# Patient Record
Sex: Female | Born: 1960 | Race: Black or African American | Hispanic: No | Marital: Single | Smoking: Former smoker
Health system: Southern US, Community
[De-identification: ages and names within clinical notes are randomized; demographics above are authoritative.]

## PROBLEM LIST (undated history)

## (undated) DIAGNOSIS — R001 Bradycardia, unspecified: Secondary | ICD-10-CM

## (undated) DIAGNOSIS — I10 Essential (primary) hypertension: Secondary | ICD-10-CM

## (undated) HISTORY — DX: Bradycardia, unspecified: R00.1

## (undated) HISTORY — PX: ABDOMINAL HYSTERECTOMY: SHX81

---

## 2017-09-24 ENCOUNTER — Encounter: Payer: Self-pay | Admitting: Obstetrics and Gynecology

## 2017-12-25 ENCOUNTER — Other Ambulatory Visit: Payer: Self-pay | Admitting: Family Medicine

## 2017-12-25 DIAGNOSIS — R1032 Left lower quadrant pain: Principal | ICD-10-CM

## 2017-12-25 DIAGNOSIS — R1031 Right lower quadrant pain: Secondary | ICD-10-CM

## 2017-12-30 ENCOUNTER — Ambulatory Visit: Admission: RE | Admit: 2017-12-30 | Payer: BLUE CROSS/BLUE SHIELD | Source: Ambulatory Visit

## 2019-03-02 ENCOUNTER — Encounter: Payer: Self-pay | Admitting: Emergency Medicine

## 2019-03-02 ENCOUNTER — Ambulatory Visit
Admission: EM | Admit: 2019-03-02 | Discharge: 2019-03-02 | Disposition: A | Discharge status: Home / Self Care | Payer: BLUE CROSS/BLUE SHIELD | Attending: Family Medicine | Admitting: Family Medicine

## 2019-03-02 ENCOUNTER — Other Ambulatory Visit: Payer: Self-pay

## 2019-03-02 DIAGNOSIS — J029 Acute pharyngitis, unspecified: Secondary | ICD-10-CM

## 2019-03-02 DIAGNOSIS — B349 Viral infection, unspecified: Secondary | ICD-10-CM

## 2019-03-02 HISTORY — DX: Essential (primary) hypertension: I10

## 2019-03-02 LAB — RAPID STREP SCREEN (MED CTR MEBANE ONLY): Streptococcus, Group A Screen (Direct): NEGATIVE

## 2019-03-02 NOTE — Discharge Instructions (Signed)
Rest, fluids, tylenol/advil, monitor for symptoms Guidelines per [facility]

## 2019-03-02 NOTE — ED Triage Notes (Signed)
Patient c/o sore throat and low grade x 1 week. She does report a cough that started last night. Patient has not taken any OTC medications for her symptoms.

## 2019-03-04 LAB — CULTURE, GROUP A STREP (THRC)

## 2019-03-07 NOTE — ED Provider Notes (Signed)
[facility]    CSN: [id number] Arrival date & time: 03/02/19  1402     History   Chief Complaint Chief Complaint  Patient presents with  . Sore Throat    APPT  . Fever    HPI Becky Barnes is a 58 y.o. female.   The history is provided by the patient.  Sore Throat   Fever  Associated symptoms: cough (mild) and sore throat   URI  Presenting symptoms: cough (mild), fever and sore throat   Severity:  Moderate Onset quality:  Sudden Duration:  1 day Timing:  Constant Progression:  Unchanged Chronicity:  New Relieved by:  None tried Ineffective treatments:  None tried Associated symptoms: no wheezing   Risk factors: no recent travel and no sick contacts     Past Medical History:  Diagnosis Date  . Hypertension     There are no active problems to display for this patient.   Past Surgical History:  Procedure Laterality Date  . ABDOMINAL HYSTERECTOMY      OB History   No obstetric history on file.      Home Medications    Prior to Admission medications   Medication Sig Start Date End Date Taking? Authorizing Provider  hydrochlorothiazide (HYDRODIURIL) 12.5 MG tablet Take by mouth. 12/22/18  Yes [provider]  lisinopril (ZESTRIL) 40 MG tablet Take by mouth. 12/22/18  Yes [provider]    Family History History reviewed. No pertinent family history.  Social History Social History   Tobacco Use  . Smoking status: Never Smoker  . Smokeless tobacco: Never Used  Substance Use Topics  . Alcohol use: Never    Frequency: Never  . Drug use: Never     Allergies   Patient has no known allergies.   Review of Systems Review of Systems  Constitutional: Positive for fever.  HENT: Positive for sore throat.   Respiratory: Positive for cough (mild). Negative for wheezing.      Physical Exam Triage Vital Signs ED Triage Vitals  Enc Vitals Group     BP 03/02/19 1426 123/88     Pulse Rate 03/02/19 1426 77   Resp 03/02/19 1426 18     Temp 03/02/19 1426 98.8 F (37.1 C)     Temp Source 03/02/19 1426 Oral     SpO2 03/02/19 1426 99 %     Weight 03/02/19 1423 190 lb (86.2 kg)     Height 03/02/19 1423 5\' 5"  (1.651 m)     Head Circumference --      Peak Flow --      Pain Score 03/02/19 1423 2     Pain Loc --      Pain Edu? --      Excl. in [place]? --    No data found.  Updated Vital Signs BP 123/88 (BP Location: Right Arm)   Pulse 77   Temp 98.8 F (37.1 C) (Oral)   Resp 18   Ht 5\' 5"  (1.651 m)   Wt 86.2 kg   SpO2 99%   BMI 31.62 kg/m   Visual Acuity Right Eye Distance:   Left Eye Distance:   Bilateral Distance:    Right Eye Near:   Left Eye Near:    Bilateral Near:     Physical Exam Vitals signs and nursing note reviewed.  Constitutional:      General: She is not in acute distress.    Appearance: She is not toxic-appearing or diaphoretic.  HENT:  Mouth/Throat:     Pharynx: Posterior oropharyngeal erythema present.  Neck:     Musculoskeletal: Neck supple.  Cardiovascular:     Rate and Rhythm: Normal rate.  Pulmonary:     Effort: Pulmonary effort is normal. No respiratory distress.     Breath sounds: No stridor.  Neurological:     Mental Status: She is alert.      UC Treatments / Results  Labs (all labs ordered are listed, but only abnormal results are displayed) Labs Reviewed  RAPID STREP SCREEN ([facility] ONLY)  CULTURE, GROUP A STREP [facility])    EKG None  Radiology No results found.  Procedures Procedures (including critical care time)  Medications Ordered in UC Medications - No data to display  Initial Impression / Assessment and Plan / UC Course  I have reviewed the triage vital signs and the nursing notes.  Pertinent labs & imaging results that were available during my care of the patient were reviewed by me and considered in my medical decision making (see chart for details).      Final Clinical Impressions(s) / UC Diagnoses    Final diagnoses:  Viral pharyngitis  Viral illness     Discharge Instructions     Rest, fluids, tylenol/advil, monitor for symptoms Guidelines [facility]    ED Prescriptions    None     1. Lab results and diagnosis reviewed with patient 2. Recommend supportive treatment as above 3. Follow-up prn if symptoms worsen or don't improve  Controlled Substance Prescriptions [place] Controlled Substance Registry consulted? Not Applicable   Becky Barnes, Nikole Swartzentruber, MD 03/07/19 [phone]

## 2019-03-10 ENCOUNTER — Ambulatory Visit
Admission: EM | Admit: 2019-03-10 | Discharge: 2019-03-10 | Disposition: A | Discharge status: Home / Self Care | Payer: BLUE CROSS/BLUE SHIELD | Attending: Family Medicine | Admitting: Family Medicine

## 2019-03-10 ENCOUNTER — Encounter: Payer: Self-pay | Admitting: Emergency Medicine

## 2019-03-10 ENCOUNTER — Other Ambulatory Visit: Payer: Self-pay

## 2019-03-10 DIAGNOSIS — J029 Acute pharyngitis, unspecified: Secondary | ICD-10-CM

## 2019-03-10 DIAGNOSIS — R197 Diarrhea, unspecified: Secondary | ICD-10-CM

## 2019-03-10 LAB — CBC WITH DIFFERENTIAL/PLATELET
Abs Immature Granulocytes: 0.01 10*3/uL (ref 0.00–0.07)
Basophils Absolute: 0 10*3/uL (ref 0.0–0.1)
Basophils Relative: 1 %
Eosinophils Absolute: 0.1 10*3/uL (ref 0.0–0.5)
Eosinophils Relative: 2 %
HCT: 41.8 % (ref 36.0–46.0)
Hemoglobin: 14.1 g/dL (ref 12.0–15.0)
Immature Granulocytes: 0 %
Lymphocytes Relative: 27 %
Lymphs Abs: 1.6 10*3/uL (ref 0.7–4.0)
MCH: 29.9 pg (ref 26.0–34.0)
MCHC: 33.7 g/dL (ref 30.0–36.0)
MCV: 88.6 fL (ref 80.0–100.0)
Monocytes Absolute: 0.4 10*3/uL (ref 0.1–1.0)
Monocytes Relative: 7 %
Neutro Abs: 3.7 10*3/uL (ref 1.7–7.7)
Neutrophils Relative %: 63 %
Platelets: 317 10*3/uL (ref 150–400)
RBC: 4.72 MIL/uL (ref 3.87–5.11)
RDW: 13 % (ref 11.5–15.5)
WBC: 5.9 10*3/uL (ref 4.0–10.5)
nRBC: 0 % (ref 0.0–0.2)

## 2019-03-10 LAB — BASIC METABOLIC PANEL
Anion gap: 8 (ref 5–15)
BUN: 20 mg/dL (ref 6–20)
CO2: 25 mmol/L (ref 22–32)
Calcium: 9.4 mg/dL (ref 8.9–10.3)
Chloride: 105 mmol/L (ref 98–111)
Creatinine, Ser: 0.95 mg/dL (ref 0.44–1.00)
GFR calc Af Amer: >60 mL/min (ref >60)
GFR calc non Af Amer: >60 mL/min (ref >60)
Glucose, Bld: 124 mg/dL — ABNORMAL HIGH (ref 70–99)
Potassium: 3.9 mmol/L (ref 3.5–5.1)
Sodium: 138 mmol/L (ref 135–145)

## 2019-03-10 NOTE — Discharge Instructions (Signed)
Imodium AD over the counter for diarrhea Increase fluids Salt water gargles

## 2019-03-10 NOTE — ED Triage Notes (Signed)
Patient c/o sore throat and diarrhea off and on for over a week.  Patient was seen on 03/02/19.  Patient states that she is starting to feel better.  Patient reports low grade fevers but has not check her temperature today.

## 2019-03-10 NOTE — ED Provider Notes (Signed)
[facility]    CSN: [id number] Arrival date & time: 03/10/19  [id number]     History   Chief Complaint Chief Complaint  Patient presents with  . Sore Throat  . Diarrhea    HPI Becky Barnes is a 58 y.o. female.   58 yo female with a c/o sore throat and diarrhea for the past approximately 2 weeks. Patient states stools are soft. Overall symptoms are improving, however have not resolved. Patient was seen here last week and had a negative strep test. States she was also tested for COVID-19  at the public health department and was negative. Patient states she works at [employer] [place]'s recently been a COVID-19 outbreak.   The history is provided by the patient.  Sore Throat   Diarrhea    Past Medical History:  Diagnosis Date  . Hypertension     There are no active problems to display for this patient.   Past Surgical History:  Procedure Laterality Date  . ABDOMINAL HYSTERECTOMY      OB History   No obstetric history on file.      Home Medications    Prior to Admission medications   Medication Sig Start Date End Date Taking? Authorizing Provider  hydrochlorothiazide (HYDRODIURIL) 12.5 MG tablet Take by mouth. 12/22/18  Yes [provider]  lisinopril (ZESTRIL) 40 MG tablet Take by mouth. 12/22/18  Yes [provider]    Family History History reviewed. No pertinent family history.  Social History Social History   Tobacco Use  . Smoking status: Never Smoker  . Smokeless tobacco: Never Used  Substance Use Topics  . Alcohol use: Never    Frequency: Never  . Drug use: Never     Allergies   Patient has no known allergies.   Review of Systems Review of Systems  Gastrointestinal: Positive for diarrhea.     Physical Exam Triage Vital Signs ED Triage Vitals  Enc Vitals Group     BP 03/10/19 0847 130/70     Pulse Rate 03/10/19 0847 62     Resp 03/10/19 0847 16     Temp 03/10/19  0847 98.2 F (36.8 C)     Temp Source 03/10/19 0847 Oral     SpO2 03/10/19 0847 99 %     Weight 03/10/19 0845 190 lb (86.2 kg)     Height 03/10/19 0845 5\' 5"  (1.651 m)     Head Circumference --      Peak Flow --      Pain Score 03/10/19 0845 3     Pain Loc --      Pain Edu? --      Excl. in GC? --    No data found.  Updated Vital Signs BP 130/70 (BP Location: Left Arm)   Pulse 62   Temp 98.2 F (36.8 C) (Oral)   Resp 16   Ht 5\' 5"  (1.651 m)   Wt 86.2 kg   SpO2 99%   BMI 31.62 kg/m   Visual Acuity Right Eye Distance:   Left Eye Distance:   Bilateral Distance:    Right Eye Near:   Left Eye Near:    Bilateral Near:     Physical Exam Vitals signs reviewed.  Constitutional:      General: She is not in acute distress.    Appearance: She is not toxic-appearing or diaphoretic.  HENT:     Mouth/Throat:     Pharynx: Posterior oropharyngeal erythema  present.  Cardiovascular:     Rate and Rhythm: Normal rate.  Pulmonary:     Effort: Pulmonary effort is normal. No respiratory distress.     Breath sounds: Normal breath sounds.  Abdominal:     General: Bowel sounds are normal. There is no distension.     Palpations: Abdomen is soft. There is no mass.     Tenderness: There is no abdominal tenderness. There is no right CVA tenderness, left CVA tenderness, guarding or rebound.  Neurological:     Mental Status: She is alert.      UC Treatments / Results  Labs (all labs ordered are listed, but only abnormal results are displayed) Labs Reviewed  BASIC METABOLIC PANEL - Abnormal; Notable for the following components:      Result Value   Glucose, Bld 124 (*)    All other components within normal limits  CBC WITH DIFFERENTIAL/PLATELET    EKG None  Radiology No results found.  Procedures Procedures (including critical care time)  Medications Ordered in UC Medications - No data to display  Initial Impression / Assessment and Plan / UC Course  I have reviewed  the triage vital signs and the nursing notes.  Pertinent labs & imaging results that were available during my care of the patient were reviewed by me and considered in my medical decision making (see chart for details).      Final Clinical Impressions(s) / UC Diagnoses   Final diagnoses:  Sore throat  Diarrhea, unspecified type     Discharge Instructions     Imodium AD over the counter for diarrhea Increase fluids Salt water gargles    ED Prescriptions    None     1. Lab results and diagnosis reviewed with patient 2. Recommend supportive treatment as above 3. Follow-up prn if symptoms worsen or don't improve   Controlled Substance Prescriptions [place] Controlled Substance Registry consulted? Not Applicable   Payton Mccallum, MD 03/10/19 1001

## 2019-08-11 ENCOUNTER — Other Ambulatory Visit: Payer: Self-pay

## 2019-08-11 DIAGNOSIS — Z20822 Contact with and (suspected) exposure to covid-19: Secondary | ICD-10-CM

## 2019-08-11 DIAGNOSIS — Z20828 Contact with and (suspected) exposure to other viral communicable diseases: Secondary | ICD-10-CM

## 2019-08-12 LAB — NOVEL CORONAVIRUS, NAA: SARS-CoV-2, NAA: NOT DETECTED

## 2019-08-14 ENCOUNTER — Telehealth: Payer: Self-pay | Admitting: General Practice

## 2019-08-14 NOTE — Telephone Encounter (Signed)
Gave patient negative test results °Patient understood  °

## 2019-08-21 ENCOUNTER — Ambulatory Visit (INDEPENDENT_AMBULATORY_CARE_PROVIDER_SITE_OTHER): Payer: Self-pay

## 2019-08-21 ENCOUNTER — Ambulatory Visit
Admission: EM | Admit: 2019-08-21 | Discharge: 2019-08-21 | Disposition: A | Discharge status: Home / Self Care | Payer: Self-pay | Attending: Family Medicine | Admitting: Family Medicine

## 2019-08-21 ENCOUNTER — Other Ambulatory Visit: Payer: Self-pay

## 2019-08-21 ENCOUNTER — Encounter: Payer: Self-pay | Admitting: Emergency Medicine

## 2019-08-21 DIAGNOSIS — Y9302 Activity, running: Secondary | ICD-10-CM

## 2019-08-21 DIAGNOSIS — S83411A Sprain of medial collateral ligament of right knee, initial encounter: Secondary | ICD-10-CM

## 2019-08-21 DIAGNOSIS — M25561 Pain in right knee: Secondary | ICD-10-CM

## 2019-08-21 NOTE — ED Provider Notes (Signed)
[facility]    CSN: [id number] Arrival date & time: 08/21/19  1804      History   Chief Complaint Chief Complaint  Patient presents with  . Knee Pain    right    HPI Cami Delawder is a 58 y.o. female.   58 yo female with a c/o right knee pain for 3 weeks but worse over the past 2-3 days. Denies any known injury, however states she's been jogging and exercising more lately. Denies any fevers, chills, rash.      Past Medical History:  Diagnosis Date  . Hypertension     There are no active problems to display for this patient.   Past Surgical History:  Procedure Laterality Date  . ABDOMINAL HYSTERECTOMY      OB History   No obstetric history on file.      Home Medications    Prior to Admission medications   Medication Sig Start Date End Date Taking? Authorizing Provider  hydrochlorothiazide (HYDRODIURIL) 12.5 MG tablet Take by mouth. 12/22/18  Yes [provider]  lisinopril (ZESTRIL) 40 MG tablet Take by mouth. 12/22/18  Yes [provider]    Family History Family History  Problem Relation Age of Onset  . Pancreatic cancer Mother   . Diabetes Mother   . Hypertension Mother   . Congestive Heart Failure Mother   . Prostate cancer Father     Social History Social History   Tobacco Use  . Smoking status: Never Smoker  . Smokeless tobacco: Never Used  Substance Use Topics  . Alcohol use: Never    Frequency: Never  . Drug use: Never     Allergies   Patient has no known allergies.   Review of Systems Review of Systems   Physical Exam Triage Vital Signs ED Triage Vitals  Enc Vitals Group     BP 08/21/19 1824 140/86     Pulse Rate 08/21/19 1824 61     Resp 08/21/19 1824 18     Temp 08/21/19 1824 98.8 F (37.1 C)     Temp Source 08/21/19 1824 Oral     SpO2 08/21/19 1824 97 %     Weight 08/21/19 1823 180 lb (81.6 kg)     Height 08/21/19 1823 5\' 5"  (1.651 m)     Head Circumference --      Peak Flow  --      Pain Score 08/21/19 1823 4     Pain Loc --      Pain Edu? --      Excl. in [place]? --    No data found.  Updated Vital Signs BP 140/86 (BP Location: Left Arm)   Pulse 61   Temp 98.8 F (37.1 C) (Oral)   Resp 18   Ht 5\' 5"  (1.651 m)   Wt 81.6 kg   SpO2 97%   BMI 29.95 kg/m   Visual Acuity Right Eye Distance:   Left Eye Distance:   Bilateral Distance:    Right Eye Near:   Left Eye Near:    Bilateral Near:     Physical Exam Vitals signs and nursing note reviewed.  Constitutional:      General: She is not in acute distress.    Appearance: She is not toxic-appearing or diaphoretic.  Musculoskeletal:     Right knee: She exhibits normal range of motion, no swelling, no effusion, no ecchymosis, no deformity, no laceration, no erythema, normal alignment, no LCL laxity, normal patellar mobility, normal meniscus  and no MCL laxity. Tenderness found. MCL tenderness noted. No patellar tendon tenderness noted.  Neurological:     Mental Status: She is alert.      UC Treatments / Results  Labs (all labs ordered are listed, but only abnormal results are displayed) Labs Reviewed - No data to display  EKG   Radiology Dg Knee Complete 4 Views Right  Result Date: 08/21/2019 CLINICAL DATA:  Acute RIGHT knee pain for 3 weeks. No known injury. Initial encounter. EXAM: RIGHT KNEE - COMPLETE 4+ VIEW COMPARISON:  None. FINDINGS: No fracture, subluxation or dislocation identified. A small knee effusion is present. No other joint abnormalities are identified. No focal bony lesions are present. IMPRESSION: Small knee effusion without other significant abnormality. Electronically Signed   By: Harmon Pier M.D.   On: 08/21/2019 19:26    Procedures Procedures (including critical care time)  Medications Ordered in UC Medications - No data to display  Initial Impression / Assessment and Plan / UC Course  I have reviewed the triage vital signs and the nursing notes.  Pertinent labs &  imaging results that were available during my care of the patient were reviewed by me and considered in my medical decision making (see chart for details).     Final Clinical Impressions(s) / UC Diagnoses   Final diagnoses:  Sprain of medial collateral ligament of right knee, initial encounter     Discharge Instructions     Rest, ice, elevation, advil/motrin    ED Prescriptions    None      1. x-ray results and diagnosis reviewed with patient 2. Recommend supportive treatment as above 3. Follow-up prn if symptoms worsen or don't improve   PDMP not reviewed this encounter.   Payton Mccallum, MD 08/23/19 [phone]

## 2019-08-21 NOTE — ED Triage Notes (Signed)
Patient in today c/o right knee pain x 3 weeks, worse in the last 2-3 days. Patient doesn't recall and injury, but thought she may have injured while exercising.

## 2019-08-21 NOTE — Discharge Instructions (Signed)
Rest, ice, elevation, advil/motrin

## 2019-11-06 DIAGNOSIS — U071 COVID-19: Secondary | ICD-10-CM

## 2019-11-06 HISTORY — DX: COVID-19: U07.1

## 2019-12-01 ENCOUNTER — Emergency Department
Admission: EM | Admit: 2019-12-01 | Discharge: 2019-12-01 | Disposition: A | Discharge status: Home / Self Care | Payer: HRSA Program | Attending: Emergency Medicine | Admitting: Emergency Medicine

## 2019-12-01 ENCOUNTER — Encounter: Payer: Self-pay | Admitting: Emergency Medicine

## 2019-12-01 ENCOUNTER — Other Ambulatory Visit: Payer: Self-pay

## 2019-12-01 DIAGNOSIS — R5381 Other malaise: Secondary | ICD-10-CM | POA: Diagnosis not present

## 2019-12-01 DIAGNOSIS — U071 COVID-19: Secondary | ICD-10-CM | POA: Diagnosis not present

## 2019-12-01 DIAGNOSIS — J069 Acute upper respiratory infection, unspecified: Secondary | ICD-10-CM | POA: Diagnosis not present

## 2019-12-01 DIAGNOSIS — R0981 Nasal congestion: Secondary | ICD-10-CM | POA: Insufficient documentation

## 2019-12-01 DIAGNOSIS — I10 Essential (primary) hypertension: Secondary | ICD-10-CM | POA: Diagnosis not present

## 2019-12-01 DIAGNOSIS — H9203 Otalgia, bilateral: Secondary | ICD-10-CM | POA: Insufficient documentation

## 2019-12-01 DIAGNOSIS — Z79899 Other long term (current) drug therapy: Secondary | ICD-10-CM | POA: Insufficient documentation

## 2019-12-01 DIAGNOSIS — R509 Fever, unspecified: Secondary | ICD-10-CM | POA: Diagnosis present

## 2019-12-01 MED ORDER — AMOXICILLIN-POT CLAVULANATE 875-125 MG PO TABS: 1.0000 | ORAL_TABLET | Freq: Two times a day (BID) | ORAL | 0 refills | Status: DC

## 2019-12-01 MED ORDER — FLUTICASONE PROPIONATE 50 MCG/ACT NA SUSP: 1.0000 | Freq: Two times a day (BID) | NASAL | 0 refills | Status: DC

## 2019-12-01 MED ORDER — CETIRIZINE HCL 10 MG PO TABS: 10.0000 mg | ORAL_TABLET | Freq: Every day | ORAL | 0 refills | Status: DC

## 2019-12-01 NOTE — ED Notes (Signed)
Cold like symptoms for 2 days. Grandchildren with patient and they also having symptoms x 1week.

## 2019-12-01 NOTE — ED Provider Notes (Signed)
[facility] Emergency Department Provider Note  ____________________________________________  Time seen: Approximately 9:00 PM  I have reviewed the triage vital signs and the nursing notes.   HISTORY  Chief Complaint URI and Fever    HPI Becky Barnes is a 59 y.o. female who presents the emergency department complaining of fevers, general malaise, nasal congestion, sinus pressure, bilateral ear pain.  Patient states that her 2 grandchildren who also are in the emergency department for evaluation have been sick for a week, she developed symptoms 2 days ago.  Patient states that it feels like previous sinus infections but is also concerned for Covid giving the pandemic.  Patient has no headache, neck pain or stiffness, chest pain, shortness of breath, coughing, abdominal pain, nausea vomiting, diarrhea or constipation.         Past Medical History:  Diagnosis Date  . Hypertension     There are no problems to display for this patient.   Past Surgical History:  Procedure Laterality Date  . ABDOMINAL HYSTERECTOMY      Prior to Admission medications   Medication Sig Start Date End Date Taking? Authorizing Provider  amoxicillin-clavulanate (AUGMENTIN) 875-125 MG tablet Take 1 tablet by mouth 2 (two) times daily. 12/01/19   Keirah Konitzer, Charline Bills, PA-C  cetirizine (ZYRTEC) 10 MG tablet Take 1 tablet (10 mg total) by mouth daily. 12/01/19   Marzell Isakson, Charline Bills, PA-C  fluticasone (FLONASE) 50 MCG/ACT nasal spray Place 1 spray into both nostrils 2 (two) times daily. 12/01/19   Khaniyah Bezek, Charline Bills, PA-C  hydrochlorothiazide (HYDRODIURIL) 12.5 MG tablet Take by mouth. 12/22/18   [provider]  lisinopril (ZESTRIL) 40 MG tablet Take by mouth. 12/22/18   [provider]    Allergies Patient has no known allergies.  Family History  Problem Relation Age of Onset  . Pancreatic cancer Mother   . Diabetes Mother   . Hypertension Mother   .  Congestive Heart Failure Mother   . Prostate cancer Father     Social History Social History   Tobacco Use  . Smoking status: Never Smoker  . Smokeless tobacco: Never Used  Substance Use Topics  . Alcohol use: Never  . Drug use: Never     Review of Systems  Constitutional: Low-grade fever/chills Eyes: No visual changes. No discharge ENT: Bilateral ear pain, sinus congestion, sinus pressure Cardiovascular: no chest pain. Respiratory: no cough. No SOB. Gastrointestinal: No abdominal pain.  No nausea, no vomiting.  No diarrhea.  No constipation. Musculoskeletal: Negative for musculoskeletal pain. Skin: Negative for rash, abrasions, lacerations, ecchymosis. Neurological: Negative for headaches, focal weakness or numbness. 10-point ROS otherwise negative.  ____________________________________________   PHYSICAL EXAM:  VITAL SIGNS: ED Triage Vitals  Enc Vitals Group     BP 12/01/19 2040 124/80     Pulse Rate 12/01/19 2040 74     Resp 12/01/19 2040 16     Temp 12/01/19 2040 98.9 F (37.2 C)     Temp Source 12/01/19 2040 Oral     SpO2 12/01/19 2040 98 %     Weight 12/01/19 2041 180 lb (81.6 kg)     Height 12/01/19 2041 5\' 5"  (1.651 m)     Head Circumference --      Peak Flow --      Pain Score 12/01/19 2041 2     Pain Loc --      Pain Edu? --      Excl. in [place]? --      Constitutional:  Alert and oriented. Well appearing and in no acute distress. Eyes: Conjunctivae are normal. PERRL. EOMI. Head: Atraumatic. ENT:      Ears: EACs unremarkable bilaterally.  TMs are moderately bulging bilaterally.  No injection or air-fluid level.      Nose: No congestion/rhinnorhea.      Mouth/Throat: Mucous membranes are moist.  Oropharynx is nonerythematous and nonedematous.  Uvula is midline. Neck: No stridor.  Neck is supple full range of motion Hematological/Lymphatic/Immunilogical: Scattered, mobile, nontender anterior cervical lymphadenopathy Cardiovascular: Normal rate,  regular rhythm. Normal S1 and S2.  Good peripheral circulation. Respiratory: Normal respiratory effort without tachypnea or retractions. Lungs CTAB. Good air entry to the bases with no decreased or absent breath sounds. Musculoskeletal: Full range of motion to all extremities. No gross deformities appreciated. Neurologic:  Normal speech and language. No gross focal neurologic deficits are appreciated.  Skin:  Skin is warm, dry and intact. No rash noted. Psychiatric: Mood and affect are normal. Speech and behavior are normal. Patient exhibits appropriate insight and judgement.   ____________________________________________   LABS (all labs ordered are listed, but only abnormal results are displayed)  Labs Reviewed  SARS CORONAVIRUS 2 (TAT 6-24 HRS)   ____________________________________________  EKG   ____________________________________________  RADIOLOGY   No results found.  ____________________________________________    PROCEDURES  Procedure(s) performed:    Procedures    Medications - No data to display   ____________________________________________   INITIAL IMPRESSION / ASSESSMENT AND PLAN / ED COURSE  Pertinent labs & imaging results that were available during my care of the patient were reviewed by me and considered in my medical decision making (see chart for details).  Review of the [place] CSRS was performed in accordance of the [facility] prior to dispensing any controlled drugs.           Patient's diagnosis is consistent with URI.  Patient presents emergency department complaining of nasal congestion, ear pain, fevers.  Differential at this time included viral URI, sinusitis, COVID-19.  At this time based on the patient's symptoms I do suspect more bacterial sinusitis than COVID-19.  Covid swab is still pending at this time.  I will prescribe prescription for Augmentin.  If COVID-19 is positive, do not take antibiotics.  If COVID-19 is negative start  antibiotic therapy.  Patient verbalizes understanding of same.  Patient will also be prescribed Flonase and Zyrtec to reduce nasal congestion and sinus pressure.  Follow-up primary care patient is given ED precautions to return to the ED for any worsening or new symptoms.     ____________________________________________  FINAL CLINICAL IMPRESSION(S) / ED DIAGNOSES  Final diagnoses:  Upper respiratory tract infection, unspecified type      NEW MEDICATIONS STARTED DURING THIS VISIT:  ED Discharge Orders         Ordered    amoxicillin-clavulanate (AUGMENTIN) 875-125 MG tablet  2 times daily     12/01/19 2151    fluticasone (FLONASE) 50 MCG/ACT nasal spray  2 times daily     12/01/19 2151    cetirizine (ZYRTEC) 10 MG tablet  Daily     12/01/19 2151              This chart was dictated using voice recognition software/Dragon. Despite best efforts to proofread, errors can occur which can change the meaning. Any change was purely unintentional.    Lanette Hampshire 12/01/19 2159    Chesley Noon, MD 12/01/19 2328

## 2019-12-01 NOTE — ED Triage Notes (Signed)
Pt to ED from home c/o fever and general fatigue, headache and runny nose since Sunday.  Took ibuprofen today at home.

## 2019-12-02 ENCOUNTER — Telehealth: Payer: Self-pay

## 2019-12-02 LAB — SARS CORONAVIRUS 2 (TAT 6-24 HRS): SARS Coronavirus 2: POSITIVE — AB

## 2019-12-02 NOTE — Telephone Encounter (Signed)
Pt notified of positive COVID-19 test results. Pt verbalized understanding. Pt reports that she has fever,congestion, sinus headache and fatigue.Pt advised to remain in self quarantine until at least 10 days since symptom onset And 3 consecutive days fever free without antipyretics And improvement in respiratory symptoms. Patient advised to utilize over the counter medications to treat symptoms. Pt advised to seek treatment in the ED if respiratory issues/distress develops.Pt advised they should only leave home to seek and medical care and must wear a mask in public. Pt instructed to limit contact with family members or caregivers in the home. Pt advised to practice social distancing and to continue to use good preventative care measures such has frequent hand washing, staying out of crowds and cleaning hard surfaces frequently touched in the home.Pt informed that the health department will likely follow up and may have additional recommendations. Will notify [facility].

## 2019-12-03 ENCOUNTER — Telehealth: Payer: Self-pay | Admitting: Internal Medicine

## 2019-12-03 NOTE — Telephone Encounter (Signed)
Called to discuss with patient about Covid symptoms and the use of bamlanivimab, a monoclonal antibody infusion for those with mild to moderate Covid symptoms and at high risk of hospitalization.   Pt appears to qualify for this infusion at [facility] due to co-morbid conditions and/or member of at risk group. Pt is 59yo with hx of htn. Would need to determine if any symptoms and, if so, duration of symptoms.   Unable to reach pt. Will attempt to reach out again at later date.   Cyndee Brightly, NP-C [facility]

## 2020-01-23 ENCOUNTER — Encounter: Payer: Self-pay | Admitting: Emergency Medicine

## 2020-01-23 ENCOUNTER — Ambulatory Visit
Admission: EM | Admit: 2020-01-23 | Discharge: 2020-01-23 | Disposition: A | Discharge status: Home / Self Care | Payer: Self-pay | Attending: Internal Medicine | Admitting: Internal Medicine

## 2020-01-23 ENCOUNTER — Other Ambulatory Visit: Payer: Self-pay

## 2020-01-23 DIAGNOSIS — B029 Zoster without complications: Secondary | ICD-10-CM

## 2020-01-23 MED ORDER — VALACYCLOVIR HCL 1 G PO TABS: 1000.0000 mg | ORAL_TABLET | Freq: Two times a day (BID) | ORAL | 0 refills | Status: DC

## 2020-01-23 NOTE — ED Triage Notes (Signed)
Patient in today c/o a painful rash that started on her chest and is now on her back x 2-3 days. Patient has tried OTC Benadryl without relief.

## 2020-01-23 NOTE — ED Provider Notes (Signed)
[facility]    CSN: [id number] Arrival date & time: 01/23/20  1446      History   Chief Complaint Chief Complaint  Patient presents with  . Rash    HPI Becky Barnes is a 59 y.o. female with history of hypertension comes to urgent care with a 3-day history of painful rash over the upper back.  Patient started having some muscle pain in the upper back and subsequently noticed eruption of the rash.  Rash was painful with some increase sensitivity of the skin.  Pain is constant, throbbing and not relieved with Benadryl.  No erythema.  No fever or chills.  Patient has not tried any over-the-counter medications.  He denies history of varicella infection during childhood.Marland Kitchen   HPI  Past Medical History:  Diagnosis Date  . Hypertension     There are no problems to display for this patient.   Past Surgical History:  Procedure Laterality Date  . ABDOMINAL HYSTERECTOMY      OB History   No obstetric history on file.      Home Medications    Prior to Admission medications   Medication Sig Start Date End Date Taking? Authorizing Provider  lisinopril (ZESTRIL) 20 MG tablet Take 20 mg by mouth daily. 01/20/20  Yes [provider]  valACYclovir (VALTREX) 1000 MG tablet Take 1 tablet (1,000 mg total) by mouth 2 (two) times daily for 10 days. 01/23/20 02/02/20  Chase Picket, MD  cetirizine (ZYRTEC) 10 MG tablet Take 1 tablet (10 mg total) by mouth daily. 12/01/19 01/23/20  Cuthriell, Charline Bills, PA-C  fluticasone (FLONASE) 50 MCG/ACT nasal spray Place 1 spray into both nostrils 2 (two) times daily. 12/01/19 01/23/20  Cuthriell, Charline Bills, PA-C  hydrochlorothiazide (HYDRODIURIL) 12.5 MG tablet Take by mouth. 12/22/18 01/23/20  [provider]    Family History Family History  Problem Relation Age of Onset  . Pancreatic cancer Mother   . Diabetes Mother   . Hypertension Mother   . Congestive Heart Failure Mother   . Prostate cancer Father      Social History Social History   Tobacco Use  . Smoking status: Former Smoker    Quit date: 01/22/1990    Years since quitting: 30.0  . Smokeless tobacco: Never Used  Substance Use Topics  . Alcohol use: Never  . Drug use: Never     Allergies   Patient has no known allergies.   Review of Systems Review of Systems  Constitutional: Negative for activity change, chills, fatigue and fever.  Respiratory: Negative for chest tightness and shortness of breath.   Cardiovascular: Negative for chest pain and palpitations.  Gastrointestinal: Negative for abdominal pain, nausea and vomiting.  Musculoskeletal: Negative for arthralgias, gait problem and joint swelling.  Skin: Positive for color change and rash.  Neurological: Negative for dizziness, light-headedness and headaches.     Physical Exam Triage Vital Signs ED Triage Vitals  Enc Vitals Group     BP 01/23/20 1458 120/71     Pulse Rate 01/23/20 1458 60     Resp 01/23/20 1458 18     Temp 01/23/20 1458 98.4 F (36.9 C)     Temp Source 01/23/20 1458 Oral     SpO2 01/23/20 1458 100 %     Weight 01/23/20 1458 176 lb (79.8 kg)     Height 01/23/20 1458 5\' 5"  (1.651 m)     Head Circumference --      Peak Flow --  Pain Score 01/23/20 1457 7     Pain Loc --      Pain Edu? --      Excl. in GC? --    No data found.  Updated Vital Signs BP 120/71 (BP Location: Left Arm)   Pulse 60   Temp 98.4 F (36.9 C) (Oral)   Resp 18   Ht 5\' 5"  (1.651 m)   Wt 79.8 kg   SpO2 100%   BMI 29.29 kg/m   Visual Acuity Right Eye Distance:   Left Eye Distance:   Bilateral Distance:    Right Eye Near:   Left Eye Near:    Bilateral Near:     Physical Exam Vitals and nursing note reviewed.  Constitutional:      General: She is not in acute distress.    Appearance: She is not ill-appearing.  Cardiovascular:     Rate and Rhythm: Normal rate and regular rhythm.     Pulses: Normal pulses.     Heart sounds: Normal heart sounds.   Musculoskeletal:        General: No swelling or tenderness. Normal range of motion.  Skin:    Comments: Multiple vesicular rash over the right upper back in the shoulder.  The rashes on the anterior upper chest.  New crops of vesicles as seen on the anterior upper chest.  Neurological:     Mental Status: She is alert.      UC Treatments / Results  Labs (all labs ordered are listed, but only abnormal results are displayed) Labs Reviewed - No data to display  EKG   Radiology No results found.  Procedures Procedures (including critical care time)  Medications Ordered in UC Medications - No data to display  Initial Impression / Assessment and Plan / UC Course  I have reviewed the triage vital signs and the nursing notes.  Pertinent labs & imaging results that were available during my care of the patient were reviewed by me and considered in my medical decision making (see chart for details).     1. Herpes Zoster, initial encounter: Valtrex 1 g twice daily for 10 days Tylenol/Motrin as needed for pain Return precautions given  Final Clinical Impressions(s) / UC Diagnoses   Final diagnoses:  Herpes zoster without complication   Discharge Instructions   None    ED Prescriptions    Medication Sig Dispense Auth. Provider   valACYclovir (VALTREX) 1000 MG tablet Take 1 tablet (1,000 mg total) by mouth 2 (two) times daily for 10 days. 20 tablet Becky Barnes, , MD     PDMP not reviewed this encounter.   Britta Mccreedy, MD 01/23/20 [phone]

## 2020-01-25 ENCOUNTER — Ambulatory Visit: Payer: Self-pay | Attending: Internal Medicine

## 2020-01-25 DIAGNOSIS — Z23 Encounter for immunization: Secondary | ICD-10-CM

## 2020-01-25 NOTE — Progress Notes (Signed)
   Covid-19 Vaccination Clinic  Name:  Becky Barnes    MRN: [id number] DOB: [birth year]  01/25/2020  Becky Barnes was observed post Covid-19 immunization for 15 minutes without incident. She was provided with Vaccine Information Sheet and instruction to access the V-Safe system.   Becky Barnes was instructed to call 911 with any severe reactions post vaccine: Marland Kitchen Difficulty breathing  . Swelling of face and throat  . A fast heartbeat  . A bad rash all over body  . Dizziness and weakness   Immunizations Administered    Name Date Dose VIS Date Route   Pfizer COVID-19 Vaccine 01/25/2020 12:23 PM 0.3 mL 10/16/2019 Intramuscular   Manufacturer: [employer], [employer]   Lot: [id number]   NDC: [address]-3

## 2020-01-30 ENCOUNTER — Telehealth: Payer: Self-pay | Admitting: Emergency Medicine

## 2020-01-30 ENCOUNTER — Ambulatory Visit (HOSPITAL_COMMUNITY)
Admission: EM | Admit: 2020-01-30 | Discharge: 2020-01-30 | Disposition: A | Discharge status: Home / Self Care | Payer: Self-pay | Attending: Urgent Care | Admitting: Urgent Care

## 2020-01-30 ENCOUNTER — Encounter (HOSPITAL_COMMUNITY): Payer: Self-pay

## 2020-01-30 ENCOUNTER — Other Ambulatory Visit: Payer: Self-pay

## 2020-01-30 DIAGNOSIS — B029 Zoster without complications: Secondary | ICD-10-CM

## 2020-01-30 DIAGNOSIS — R21 Rash and other nonspecific skin eruption: Secondary | ICD-10-CM

## 2020-01-30 DIAGNOSIS — B0229 Other postherpetic nervous system involvement: Secondary | ICD-10-CM

## 2020-01-30 MED ORDER — VALACYCLOVIR HCL 1 G PO TABS: 1000.0000 mg | ORAL_TABLET | Freq: Three times a day (TID) | ORAL | 0 refills | Status: DC

## 2020-01-30 MED ORDER — TRAMADOL HCL 50 MG PO TABS: 50.0000 mg | ORAL_TABLET | Freq: Four times a day (QID) | ORAL | 0 refills | Status: DC | PRN

## 2020-01-30 NOTE — ED Triage Notes (Signed)
Pt state she has shingles and she has pain in other places other then where the shingles is at. Pt went to a urgentcare where she was told she has  shingles last Satuday.

## 2020-01-30 NOTE — Discharge Instructions (Addendum)
Please keep taking ibuprofen for what I suspect is lymph node pain and inflammation secondary to your shingles rash.  It is okay to take 800 mg ibuprofen 3 times a day with food.  I am extending your Valtrex to the correct dose of 3 times a day for the next 7 days.  Use tramadol as needed for breakthrough pain.  Please follow-up with your PCP if your rash persists.

## 2020-01-30 NOTE — ED Provider Notes (Signed)
[place]   MRN: [id number] DOB: [birth year]  Subjective:   Becky Barnes is a 59 y.o. female presenting for recheck regarding her shingles rash.  Patient has felt significant pain across her right upper back extending in dermatomal pattern across her right shoulder right upper chest.  She is also had pain in both of her armpits.  Denies fever, drainage of pus or bleeding.  Patient has been using ibuprofen with some relief.  However, she had a consult by video visit and was recommended to come in for an evaluation.  She has been taking Valtrex twice a day.  States that she does not like to take medications.  Would like to minimize [place] if she gets prescribed here.  No current facility-administered medications for this encounter.  Current Outpatient Medications:  .  lisinopril (ZESTRIL) 20 MG tablet, Take 20 mg by mouth daily., Disp: , Rfl:  .  valACYclovir (VALTREX) 1000 MG tablet, Take 1 tablet (1,000 mg total) by mouth 2 (two) times daily for 10 days., Disp: 20 tablet, Rfl: 0   No Known Allergies  Past Medical History:  Diagnosis Date  . Hypertension      Past Surgical History:  Procedure Laterality Date  . ABDOMINAL HYSTERECTOMY      Family History  Problem Relation Age of Onset  . Pancreatic cancer Mother   . Diabetes Mother   . Hypertension Mother   . Congestive Heart Failure Mother   . Prostate cancer Father     Social History   Tobacco Use  . Smoking status: Former Smoker    Quit date: 01/22/1990    Years since quitting: 30.0  . Smokeless tobacco: Never Used  Substance Use Topics  . Alcohol use: Never  . Drug use: Never    ROS   Objective:   Vitals: BP (!) 141/84 (BP Location: Right Arm)   Pulse 62   Temp 98.4 F (36.9 C) (Oral)   Resp 18   Wt 188 lb 6.4 oz (85.5 kg)   SpO2 100%   BMI 31.35 kg/m   Physical Exam Constitutional:      General: She is not in acute distress.    Appearance: Normal appearance. She is well-developed. She  is not ill-appearing, toxic-appearing or diaphoretic.  HENT:     Head: Normocephalic and atraumatic.     Nose: Nose normal.     Mouth/Throat:     Mouth: Mucous membranes are moist.     Pharynx: Oropharynx is clear.  Eyes:     General: No scleral icterus.       Right eye: No discharge.        Left eye: No discharge.     Extraocular Movements: Extraocular movements intact.     Pupils: Pupils are equal, round, and reactive to light.  Cardiovascular:     Rate and Rhythm: Normal rate.  Pulmonary:     Effort: Pulmonary effort is normal.  Lymphadenopathy:     Head:     Right side of head: No submental, submandibular, tonsillar, preauricular, posterior auricular or occipital adenopathy.     Left side of head: No submental, submandibular, tonsillar, preauricular, posterior auricular or occipital adenopathy.     Cervical: No cervical adenopathy.     Upper Body:     Right upper body: No supraclavicular or epitrochlear adenopathy.     Left upper body: No supraclavicular or epitrochlear adenopathy.     Comments: No lymphadenopathy palpated but has tenderness in both axilla.  Skin:  General: Skin is warm and dry.       Neurological:     General: No focal deficit present.     Mental Status: She is alert and oriented to person, place, and time.  Psychiatric:        Mood and Affect: Mood normal.        Behavior: Behavior normal.        Thought Content: Thought content normal.        Judgment: Judgment normal.      Assessment and Plan :   1. Herpes zoster without complication   2. Post herpetic neuralgia     Had extensive discussion with patient about my suspicion that she has postherpetic neuralgia and possible lymphadenopathy related to the same.  I will increase patient's Valtrex dose to 3 times daily for her shingles rash for 1 week.  Recommended patient continue ibuprofen that she did not want to get more prescriptions.  She was agreeable to using tramadol for severe pain.  Counseled patient on potential for adverse effects with medications prescribed/recommended today, ER and return-to-clinic precautions discussed, patient verbalized understanding.    Wallis Bamberg, PA-C 01/31/20 1014

## 2020-01-30 NOTE — Progress Notes (Signed)
Based on what you shared with me, I feel your condition warrants further evaluation and I recommend that you be seen for a face to face office visit.  It's bizarre that you'd be experiencing pain under both arms and/or away from the rash.  It is common to have pain long after the rash goes away, but not in locations away from the rash.  Shingles typically affects only one dermatome (nerve distribution).  If you are having pain in other places, you should be seen in-person.      NOTE: If you entered your credit card information for this eVisit, you will not be charged. You may see a "hold" on your card for the $35 but that hold will drop off and you will not have a charge processed.   If you are having a true medical emergency please call 911.      For an urgent face to face visit, [place] has five urgent care centers for your convenience:      NEW:  [facility] at [facility] Directions [phone] [address] [place], [place] [address] . 10 am - 6pm Monday - Friday    [facility]) Get Driving Directions [phone] [address] [place], [place] [address] . 10 am to 8 pm Monday-Friday . 12 pm to 8 pm [facility] Care at [facility] Get Driving Directions [phone] [address], [address] [place], [place] [address] . 8 am to 8 pm Monday-Friday . 9 am to 6 pm Saturday . 11 am to 6 pm Sunday     [facility] at [facility] Get Driving Directions  [phone] [address] [place], [place] [address] . 8 am to 8 pm Monday-Friday . 8 am to 4 pm [facility] at [facility] Directions [phone] [address] [place], [place] [address] . 12 pm to 6 pm Monday-Friday      Your e-visit answers were reviewed by a board certified advanced clinical practitioner to complete  your personal care plan.  Thank you for using e-Visits.   Approximately 5 minutes was used in reviewing the patient's chart, questionnaire, prescribing medications, and documentation.

## 2020-01-31 ENCOUNTER — Telehealth (HOSPITAL_COMMUNITY): Payer: Self-pay | Admitting: Urgent Care

## 2020-01-31 MED ORDER — GABAPENTIN 300 MG PO CAPS: 300.0000 mg | ORAL_CAPSULE | Freq: Three times a day (TID) | ORAL | 0 refills | Status: DC

## 2020-01-31 NOTE — Telephone Encounter (Signed)
Patient requested her prescription for gabapentin be sent to [facility] in [place].  I sent the prescription just now.

## 2020-02-23 ENCOUNTER — Ambulatory Visit: Payer: Self-pay | Attending: Internal Medicine

## 2020-02-23 DIAGNOSIS — Z23 Encounter for immunization: Secondary | ICD-10-CM

## 2020-02-23 NOTE — Progress Notes (Signed)
   Covid-19 Vaccination Clinic  Name:  Becky Barnes    MRN: [id number] DOB: [birth year]  02/23/2020  [name] was observed post Covid-19 immunization for 30 minutes based on pre-vaccination screening without incident. She was provided with Vaccine Information Sheet and instruction to access the V-Safe system.   [name] was instructed to call 911 with any severe reactions post vaccine: Marland Kitchen Difficulty breathing  . Swelling of face and throat  . A fast heartbeat  . A bad rash all over body  . Dizziness and weakness   Immunizations Administered    Name Date Dose VIS Date Route   Pfizer COVID-19 Vaccine 02/23/2020 12:11 PM 0.3 mL 12/30/2018 Intramuscular   Manufacturer: [employer], [employer]   Lot: [id number]   NDC: [address]-6

## 2020-02-23 NOTE — Progress Notes (Signed)
   Covid-19 Vaccination Clinic  Name:  Becky Barnes    MRN: [id number] DOB: 02/15/1961  02/23/2020  Becky Barnes was observed post Covid-19 immunization for 30 minutes based on pre-vaccination screening .  During the observation period, she experienced an adverse reaction with the following symptoms:  dizziness.  Assessment : Time of assessment 1229 Alert and oriented.  Actions taken:  Vitals sign taken  VAERS form obtained and completed by RN. VS are BP- 155/94, P- 55, O2- 100%. Water given, patient refused food, she reports that she has not eaten today and refuses any food.    Medications administered: No medication administered.  Disposition: Reports no further symptoms of adverse reaction after observation for 40 minutes. Discharged home. The Patient was provided with Vaccine Information Sheet and instruction to access the V-Safe system.    Immunizations Administered    Name Date Dose VIS Date Route   Pfizer COVID-19 Vaccine 02/23/2020 12:11 PM 0.3 mL 12/30/2018 Intramuscular   Manufacturer: [employer], [employer]   Lot: [id number]   NDC: [address]-1

## 2020-03-01 ENCOUNTER — Telehealth: Payer: Self-pay | Admitting: *Deleted

## 2020-03-01 NOTE — Telephone Encounter (Signed)
Called and spoke with patient in regards to her second COVID vaccine that she received on April 20th. Patient stated that she was feeling fine and other than some dizziness after her injection she did not experience any other symptoms. No allergic reaction detected.

## 2020-05-29 ENCOUNTER — Emergency Department: Payer: Self-pay

## 2020-05-29 ENCOUNTER — Observation Stay
Admission: EM | Admit: 2020-05-29 | Discharge: 2020-05-31 | Disposition: A | Discharge status: Home / Self Care | Payer: Self-pay | Attending: Internal Medicine | Admitting: Internal Medicine

## 2020-05-29 ENCOUNTER — Other Ambulatory Visit: Payer: Self-pay

## 2020-05-29 DIAGNOSIS — Z20822 Contact with and (suspected) exposure to covid-19: Secondary | ICD-10-CM | POA: Insufficient documentation

## 2020-05-29 DIAGNOSIS — Z87891 Personal history of nicotine dependence: Secondary | ICD-10-CM | POA: Insufficient documentation

## 2020-05-29 DIAGNOSIS — E785 Hyperlipidemia, unspecified: Secondary | ICD-10-CM | POA: Diagnosis present

## 2020-05-29 DIAGNOSIS — R001 Bradycardia, unspecified: Principal | ICD-10-CM | POA: Diagnosis present

## 2020-05-29 DIAGNOSIS — E876 Hypokalemia: Secondary | ICD-10-CM | POA: Insufficient documentation

## 2020-05-29 DIAGNOSIS — I10 Essential (primary) hypertension: Secondary | ICD-10-CM | POA: Diagnosis present

## 2020-05-29 DIAGNOSIS — R42 Dizziness and giddiness: Secondary | ICD-10-CM | POA: Insufficient documentation

## 2020-05-29 DIAGNOSIS — Z8616 Personal history of COVID-19: Secondary | ICD-10-CM | POA: Insufficient documentation

## 2020-05-29 DIAGNOSIS — R110 Nausea: Secondary | ICD-10-CM | POA: Insufficient documentation

## 2020-05-29 DIAGNOSIS — Z79899 Other long term (current) drug therapy: Secondary | ICD-10-CM | POA: Insufficient documentation

## 2020-05-29 DIAGNOSIS — R7989 Other specified abnormal findings of blood chemistry: Secondary | ICD-10-CM

## 2020-05-29 DIAGNOSIS — R202 Paresthesia of skin: Secondary | ICD-10-CM | POA: Insufficient documentation

## 2020-05-29 LAB — PHOSPHORUS: Phosphorus: 3.3 mg/dL (ref 2.5–4.6)

## 2020-05-29 LAB — BASIC METABOLIC PANEL
Anion gap: 7 (ref 5–15)
BUN: 10 mg/dL (ref 8–23)
CO2: 27 mmol/L (ref 22–32)
Calcium: 9.5 mg/dL (ref 8.9–10.3)
Chloride: 108 mmol/L (ref 98–111)
Creatinine, Ser: 0.88 mg/dL (ref 0.44–1.00)
GFR calc Af Amer: >60 mL/min (ref >60)
GFR calc non Af Amer: >60 mL/min (ref >60)
Glucose, Bld: 99 mg/dL (ref 70–99)
Potassium: 4.4 mmol/L (ref 3.5–5.1)
Sodium: 142 mmol/L (ref 135–145)

## 2020-05-29 LAB — URINALYSIS, COMPLETE (UACMP) WITH MICROSCOPIC
Bacteria, UA: NONE SEEN
Bilirubin Urine: NEGATIVE
Glucose, UA: NEGATIVE mg/dL
Ketones, ur: NEGATIVE mg/dL
Leukocytes,Ua: NEGATIVE
Nitrite: NEGATIVE
Protein, ur: NEGATIVE mg/dL
Specific Gravity, Urine: 1.006 (ref 1.005–1.030)
Squamous Epithelial / HPF: NONE SEEN (ref 0–5)
pH: 6 (ref 5.0–8.0)

## 2020-05-29 LAB — HEPATIC FUNCTION PANEL
ALT: 47 U/L — ABNORMAL HIGH (ref 0–44)
AST: 31 U/L (ref 15–41)
Albumin: 4.4 g/dL (ref 3.5–5.0)
Alkaline Phosphatase: 56 U/L (ref 38–126)
Bilirubin, Direct: <0.1 mg/dL (ref 0.0–0.2)
Total Bilirubin: 0.7 mg/dL (ref 0.3–1.2)
Total Protein: 7.6 g/dL (ref 6.5–8.1)

## 2020-05-29 LAB — CBC
HCT: 39.7 % (ref 36.0–46.0)
Hemoglobin: 13.1 g/dL (ref 12.0–15.0)
MCH: 30 pg (ref 26.0–34.0)
MCHC: 33 g/dL (ref 30.0–36.0)
MCV: 91.1 fL (ref 80.0–100.0)
Platelets: 310 10*3/uL (ref 150–400)
RBC: 4.36 MIL/uL (ref 3.87–5.11)
RDW: 12.8 % (ref 11.5–15.5)
WBC: 4.6 10*3/uL (ref 4.0–10.5)
nRBC: 0 % (ref 0.0–0.2)

## 2020-05-29 LAB — FIBRIN DERIVATIVES D-DIMER (ARMC ONLY): Fibrin derivatives D-dimer (ARMC): 760.55 ng/mL (FEU) — ABNORMAL HIGH (ref 0.00–499.00)

## 2020-05-29 LAB — SARS CORONAVIRUS 2 BY RT PCR (HOSPITAL ORDER, PERFORMED IN ~~LOC~~ HOSPITAL LAB): SARS Coronavirus 2: NEGATIVE

## 2020-05-29 LAB — TROPONIN I (HIGH SENSITIVITY)
Troponin I (High Sensitivity): 3 ng/L (ref <18)
Troponin I (High Sensitivity): 3 ng/L (ref <18)

## 2020-05-29 LAB — BRAIN NATRIURETIC PEPTIDE: B Natriuretic Peptide: 97.8 pg/mL (ref 0.0–100.0)

## 2020-05-29 LAB — CK: Total CK: 90 U/L (ref 38–234)

## 2020-05-29 LAB — TSH: TSH: 2.175 u[IU]/mL (ref 0.350–4.500)

## 2020-05-29 LAB — MAGNESIUM: Magnesium: 2.3 mg/dL (ref 1.7–2.4)

## 2020-05-29 MED ORDER — ACETAMINOPHEN 650 MG RE SUPP: 650.0000 mg | Freq: Four times a day (QID) | RECTAL | Status: DC | PRN

## 2020-05-29 MED ORDER — SODIUM CHLORIDE 0.9% FLUSH
3.0000 mL | Freq: Once | INTRAVENOUS | Status: AC
  Administered 2020-05-29: 3 mL via INTRAVENOUS

## 2020-05-29 MED ORDER — ONDANSETRON HCL 4 MG PO TABS: 4.0000 mg | ORAL_TABLET | Freq: Four times a day (QID) | ORAL | Status: DC | PRN

## 2020-05-29 MED ORDER — ACETAMINOPHEN 325 MG PO TABS: 650.0000 mg | ORAL_TABLET | Freq: Four times a day (QID) | ORAL | Status: DC | PRN

## 2020-05-29 MED ORDER — SODIUM CHLORIDE 0.9% FLUSH
3.0000 mL | Freq: Two times a day (BID) | INTRAVENOUS | Status: DC
  Administered 2020-05-29 – 2020-05-31 (×4): 3 mL via INTRAVENOUS

## 2020-05-29 MED ORDER — ATORVASTATIN CALCIUM 20 MG PO TABS
40.0000 mg | ORAL_TABLET | Freq: Every day | ORAL | Status: DC
  Administered 2020-05-29 – 2020-05-31 (×3): 40 mg via ORAL
  Filled 2020-05-29 (×3): qty 2

## 2020-05-29 MED ORDER — DOCUSATE SODIUM 100 MG PO CAPS
100.0000 mg | ORAL_CAPSULE | Freq: Two times a day (BID) | ORAL | Status: DC
  Administered 2020-05-29 – 2020-05-31 (×4): 100 mg via ORAL
  Filled 2020-05-29 (×4): qty 1

## 2020-05-29 MED ORDER — SODIUM CHLORIDE 0.9 % IV BOLUS
500.0000 mL | Freq: Once | INTRAVENOUS | Status: AC
  Administered 2020-05-29: 500 mL via INTRAVENOUS

## 2020-05-29 MED ORDER — HYDROCODONE-ACETAMINOPHEN 5-325 MG PO TABS: 1.0000 | ORAL_TABLET | ORAL | Status: DC | PRN

## 2020-05-29 MED ORDER — ONDANSETRON HCL 4 MG/2ML IJ SOLN: 4.0000 mg | Freq: Four times a day (QID) | INTRAMUSCULAR | Status: DC | PRN

## 2020-05-29 MED ORDER — SODIUM CHLORIDE 0.9 % IV SOLN: INTRAVENOUS | Status: AC

## 2020-05-29 NOTE — H&P (Addendum)
Quintella ReichertJacqueline Rothbauer ZOX:[id number] DOB: 01/23/1961 DOA: 05/29/2020     PCP: [place] CarboBynum, William Edwards IV, MD   Outpatient Specialists:   NONE   Patient arrived to ER on 05/29/20 at 1153 Referred by Attending Willy Eddyobinson, Patrick, MD   Patient coming from: home Lives alone  Chief Complaint:    Chief Complaint  Patient presents with  . Dizziness  . Weakness    HPI: Quintella ReichertJacqueline Spruiell is a 59 y.o. female with medical history significant of Covid infectionin Jan 2021 HDL, Herpes Zoster, HTN    Presented with   Lightheaded, dizzy, She was noting that she has bradycardia when monitoring her HR by her watch. With her HR going as low as 38 Reports palpitations and flutter, generalized fatigue Her blood pressure has been low she has been feeling very weak all over Did not endorse any chest pain shortness of breath She recently has decreased her blood pressure medications Reports she has not been feeling well for the past few days She has not been taking her meds for the past few days  Infectious risk factors:  Reports  fatigue    Has  Been fully vaccinated against COVID    Initial COVID TEST  NEGATIVE   Lab Results  Component Value Date   SARSCOV2NAA NEGATIVE 05/29/2020   SARSCOV2NAA POSITIVE (A) 12/01/2019   SARSCOV2NAA Not Detected 08/11/2019    Regarding pertinent Chronic problems:   Hyperlipidemia -  on statins lipitor   HTN on lisinopril, HCTZ      While in ER: Noted to be bradycardic down to 30s on EKG 45 patient states at baseline her heart rate 150.  TSH within normal limits EKG nonischemic   ER Provider Called:  Cardiology   Dr. Dr. Kirke CorinArida consulting in the AM. Dr. Theda BelfastNishant is on for tonight  They Recommend admit to medicine   Will see in AM   Hospitalist was called for admission for symptomatic bradycardia  The following Work up has been ordered so far:  Orders Placed This Encounter  Procedures  . SARS Coronavirus 2 by RT PCR (hospital order, performed in  [facility]) Nasopharyngeal Nasopharyngeal Swab  . CT HEAD WO CONTRAST  . DG Chest Portable 1 View  . Urinalysis, Complete w Microscopic  . CBC  . Basic metabolic panel  . Hepatic function panel  . TSH  . Cardiac monitoring  . Document Height and Actual Weight  . Saline Lock IV, Maintain IV access  . Consult to hospitalist  ALL PATIENTS BEING ADMITTED/HAVING PROCEDURES NEED COVID-19 SCREENING  . Pulse oximetry, continuous  . CBG monitoring, ED  . EKG 12-Lead  . ED EKG   Following Medications were ordered in ER: Medications  sodium chloride flush (NS) 0.9 % injection 3 mL (3 mLs Intravenous Given 05/29/20 1717)  sodium chloride 0.9 % bolus 500 mL (500 mLs Intravenous New Bag/Given 05/29/20 1716)        Consult Orders  (From admission, onward)         Start     Ordered   05/29/20 1759  Consult to hospitalist  ALL PATIENTS BEING ADMITTED/HAVING PROCEDURES NEED COVID-19 SCREENING  Once       Comments: ALL PATIENTS BEING ADMITTED/HAVING PROCEDURES NEED COVID-19 SCREENING  Provider:  (Not yet assigned)  Question Answer Comment  Place call to: [phone]   Reason for Consult Admit      05/29/20 1758          Significant initial  Findings: Abnormal Labs  Reviewed  URINALYSIS, COMPLETE (UACMP) WITH MICROSCOPIC - Abnormal; Notable for the following components:      Result Value   Color, Urine STRAW (*)    APPearance CLEAR (*)    Hgb urine dipstick SMALL (*)    All other components within normal limits  HEPATIC FUNCTION PANEL - Abnormal; Notable for the following components:   ALT 47 (*)    All other components within normal limits    Otherwise labs showing:    Recent Labs  Lab 05/29/20 1358  NA 142  K 4.4  CO2 27  GLUCOSE 99  BUN 10  CREATININE 0.88  CALCIUM 9.5    Cr  stable,   Lab Results  Component Value Date   CREATININE 0.88 05/29/2020   CREATININE 0.95 03/10/2019    Recent Labs  Lab 05/29/20 1358  AST 31  ALT 47*  ALKPHOS 56    BILITOT 0.7  PROT 7.6  ALBUMIN 4.4   Lab Results  Component Value Date   CALCIUM 9.5 05/29/2020    WBC     Component Value Date/Time   WBC 4.6 05/29/2020 1358   ANC    Component Value Date/Time   NEUTROABS 3.7 03/10/2019 0920   ALC No components found for: LYMPHAB    Plt: Lab Results  Component Value Date   PLT 310 05/29/2020     COVID-19 Labs  No results for input(s): DDIMER, FERRITIN, LDH, CRP in the last 72 hours.  Lab Results  Component Value Date   SARSCOV2NAA POSITIVE (A) 12/01/2019   SARSCOV2NAA Not Detected 08/11/2019      HG/HCT  stable,      Component Value Date/Time   HGB 13.1 05/29/2020 1358   HCT 39.7 05/29/2020 1358    No results for input(s): LIPASE, AMYLASE in the last 168 hours. No results for input(s): AMMONIA in the last 168 hours.    Troponin 3-3  Cardiac Panel (last 3 results) Recent Labs    05/29/20 1928  CKTOTAL 90    ECG: Ordered Personally reviewed by me showing: HR : 45 Rhythm:  Sinus bradycardia    no evidence of ischemic changes QTC 399   BNP (last 3 results) Recent Labs    05/29/20 1358  BNP 97.8      DM  labs:  HbA1C: No results for input(s): HGBA1C in the last 8760 hours.     CBG (last 3)  No results for input(s): GLUCAP in the last 72 hours.     UA  no evidence of UTI     Urine analysis:    Component Value Date/Time   COLORURINE STRAW (A) 05/29/2020 1300   APPEARANCEUR CLEAR (A) 05/29/2020 1300   LABSPEC 1.006 05/29/2020 1300   PHURINE 6.0 05/29/2020 1300   GLUCOSEU NEGATIVE 05/29/2020 1300   HGBUR SMALL (A) 05/29/2020 1300   BILIRUBINUR NEGATIVE 05/29/2020 1300   KETONESUR NEGATIVE 05/29/2020 1300   PROTEINUR NEGATIVE 05/29/2020 1300   NITRITE NEGATIVE 05/29/2020 1300   LEUKOCYTESUR NEGATIVE 05/29/2020 1300     TSH 2.175   CT HEAD NON acute  CXR - NON acute   ED Triage Vitals  Enc Vitals Group     BP 05/29/20 1220 (!) 151/86     Pulse Rate 05/29/20 1220 (!) 43     Resp  05/29/20 1220 16     Temp 05/29/20 1220 98.9 F (37.2 C)     Temp Source 05/29/20 1220 Oral     SpO2 05/29/20 1220 100 %  Weight 05/29/20 1243 170 lb (77.1 kg)     Height 05/29/20 1243 5\' 5"  (1.651 m)     Head Circumference --      Peak Flow --      Pain Score 05/29/20 1243 0     Pain Loc --      Pain Edu? --      Excl. in GC? --   TMAX(24)@       Latest  Blood pressure (!) 173/92, pulse (!) 37, temperature 98.9 F (37.2 C), temperature source Oral, resp. rate 15, height 5\' 5"  (1.651 m), weight 77.1 kg, SpO2 100 %.   Review of Systems:    Pertinent positives include: palpitions  Constitutional:  No weight loss, night sweats, Fevers, chills, fatigue, weight loss  HEENT:  No headaches, Difficulty swallowing,Tooth/dental problems,Sore throat,  No sneezing, itching, ear ache, nasal congestion, post nasal drip,  Cardio-vascular:  No chest pain, Orthopnea, PND, anasarca, dizziness, palpitations.no Bilateral lower extremity swelling  GI:  No heartburn, indigestion, abdominal pain, nausea, vomiting, diarrhea, change in bowel habits, loss of appetite, melena, blood in stool, hematemesis Resp:  no shortness of breath at rest. No dyspnea on exertion, No excess mucus, no productive cough, No non-productive cough, No coughing up of blood.No change in color of mucus.No wheezing. Skin:  no rash or lesions. No jaundice GU:  no dysuria, change in color of urine, no urgency or frequency. No straining to urinate.  No flank pain.  Musculoskeletal:  No joint pain or no joint swelling. No decreased range of motion. No back pain.  Psych:  No change in mood or affect. No depression or anxiety. No memory loss.  Neuro: no localizing neurological complaints, no tingling, no weakness, no double vision, no gait abnormality, no slurred speech, no confusion  All systems reviewed and apart from HOPI all are negative  Past Medical History:   Past Medical History:  Diagnosis Date  . Hypertension      Past Surgical History:  Procedure Laterality Date  . ABDOMINAL HYSTERECTOMY      Social History:  Ambulatory  independently      reports that she quit smoking about 30 years ago. She has never used smokeless tobacco. She reports that she does not drink alcohol and does not use drugs.  Family History:   Family History  Problem Relation Age of Onset  . Pancreatic cancer Mother   . Diabetes Mother   . Hypertension Mother   . Congestive Heart Failure Mother   . Prostate cancer Father     Allergies: No Known Allergies   Prior to Admission medications   Medication Sig Start Date End Date Taking? Authorizing Provider  gabapentin (NEURONTIN) 300 MG capsule Take 1 capsule (300 mg total) by mouth 3 (three) times daily. 01/31/20   , PA-C  lisinopril (ZESTRIL) 20 MG tablet Take 20 mg by mouth daily. 01/20/20   [provider]  traMADol (ULTRAM) 50 MG tablet Take 1 tablet (50 mg total) by mouth every 6 (six) hours as needed. 01/30/20   01/22/20, PA-C  valACYclovir (VALTREX) 1000 MG tablet Take 1 tablet (1,000 mg total) by mouth 3 (three) times daily. 01/30/20   Wallis Bamberg, PA-C  cetirizine (ZYRTEC) 10 MG tablet Take 1 tablet (10 mg total) by mouth daily. 12/01/19 01/23/20  Cuthriell, 12/03/19, PA-C  fluticasone (FLONASE) 50 MCG/ACT nasal spray Place 1 spray into both nostrils 2 (two) times daily. 12/01/19 01/23/20  Cuthriell, 12/03/19, PA-C  hydrochlorothiazide (HYDRODIURIL) 12.5  MG tablet Take by mouth. 12/22/18 01/23/20  [provider]   Physical Exam: Vitals with BMI 05/29/2020 05/29/2020 05/29/2020  Height - - -  Weight - - -  BMI - - -  Systolic - - -  Diastolic - - -  Pulse 37 37 39   1. General:  in No  Acute distress    Chronically ill  -appearing 2. Psychological: Alert and Oriented 3. Head/ENT:    Dry Mucous Membranes                          Head Non traumatic, neck supple                          Poor Dentition 4. SKIN:   decreased Skin  turgor,  Skin clean Dry and intact no rash 5. Heart: Regular rate and rhythm no Murmur, no Rub or gallop 6. Lungs:  no wheezes or crackles   7. Abdomen: Soft,  non-tender, Non distended   obese  bowel sounds present 8. Lower extremities: no clubbing, cyanosis, no edema 9. Neurologically Grossly intact, moving all 4 extremities equally  10. MSK: Normal range of motion   All other LABS:     Recent Labs  Lab 05/29/20 1358  WBC 4.6  HGB 13.1  HCT 39.7  MCV 91.1  PLT 310     Recent Labs  Lab 05/29/20 1358  NA 142  K 4.4  CL 108  CO2 27  GLUCOSE 99  BUN 10  CREATININE 0.88  CALCIUM 9.5     Recent Labs  Lab 05/29/20 1358  AST 31  ALT 47*  ALKPHOS 56  BILITOT 0.7  PROT 7.6  ALBUMIN 4.4       Cultures:    Component Value Date/Time   SDES  03/02/2019 1428    THROAT Performed at [facility], [address], [place], [place] [address]    SPECREQUEST  03/02/2019 1428    NONE Reflexed from [id number] Performed at [facility], [address], [place], [place] [address]    CULT  03/02/2019 1428    NO GROUP A STREP (S.PYOGENES) ISOLATED Performed at [facility], [address] [place], [place] [address]    REPTSTATUS 03/04/2019 FINAL 03/02/2019 1428     Radiological Exams on Admission: CT HEAD WO CONTRAST  Result Date: 05/29/2020 CLINICAL DATA:  59 year old female with history of dizziness and lightheadedness. Weakness. EXAM: CT HEAD WITHOUT CONTRAST TECHNIQUE: Contiguous axial images were obtained from the base of the skull through the vertex without intravenous contrast. COMPARISON:  No priors. FINDINGS: Brain: No evidence of acute infarction, hemorrhage, hydrocephalus, extra-axial collection or mass lesion/mass effect. Vascular: No hyperdense vessel or unexpected calcification. Skull: Normal. Negative for fracture or focal lesion. Sinuses/Orbits: No acute finding. Other: None. IMPRESSION: 1. No acute intracranial abnormalities. The  appearance of the brain is normal for age. Electronically Signed   By: Trudie Reed M.D.   On: 05/29/2020 13:22   DG Chest Portable 1 View  Result Date: 05/29/2020 CLINICAL DATA:  59 year old female with weakness. EXAM: PORTABLE CHEST 1 VIEW COMPARISON:  None. FINDINGS: No focal consolidation, pleural effusion, pneumothorax. Top-normal cardiac size. No acute osseous pathology. IMPRESSION: No active disease. Electronically Signed   By: Elgie Collard M.D.   On: 05/29/2020 17:05    Chart has been reviewed    Assessment/Plan  59 y.o. female with  medical history significant of Covid infectionin Jan 2021 HDL, Herpes Zoster, HTN     Admitted for symptomatic bradycardia  Present on Admission: . Symptomatic bradycardia -admit observation progressive care unit cardiac enzymes unremarkable obtain echogram TSH unremarkable hold antihypertensive and monitor blood pressure monitor on telemetry for any evidence of pauses or AV block.  Appreciate cardiology consult in a.m. Keep n.p.o. postmidnight day just in case she needs any procedures   . Essential hypertension allow permissive hypertension for tonight to hold   . Hyperlipemia -stable continue home medications  Mild d.dimer elevation no recent CP or SOB no hypoxia or tachycardia  For now continue to monitor  Other plan as per orders.  DVT prophylaxis:  SCD   Code Status:    Code Status: Not on file FULL CODE  as per patient  I had personally discussed CODE STATUS with patient     Family Communication:   Family at  Bedside  plan of care was discussed  Daughter,   Disposition Plan:      To home once workup is complete and patient is stable   Following barriers for discharge:                            Electrolytes corrected                            Will need consultants to evaluate patient prior to discharge                        Consults called:  Cardiology ER provider discussed with [facility], Dr. Kirke Corin consulting in the AM. Dr.  Theda Belfast is on for tonight   Admission status:  ED Disposition    ED Disposition Condition Comment   Admit  Hospital Area: [facility] [[id number]]  Level of Care: Progressive Cardiac [[id number]]  Admit to Progressive based on following criteria: CARDIOVASCULAR & THORACIC of moderate stability with acute coronary syndrome symptoms/low risk myocardial infarction/hypertensive urgency/arrhythmias/heart failure potentially compromising stability and stable post cardiovascular intervention patients.  Covid Evaluation: Confirmed COVID Negative  Diagnosis: Bradycardia [[id number]]  Admitting Physician: Therisa Doyne [[id number]]  Attending Physician: Therisa Doyne [[id number]]       Obs      Level of care   progressive  Lab Results  Component Value Date   SARSCOV2NAA NEGATIVE 05/29/2020     Precautions: admitted as Covid Negative     PPE: Used by the provider:   N95  eye Goggles,  Gloves    Therisa Doyne 05/29/2020, 9:54 PM    [facility]     after 2 AM please page floor coverage PA If 7AM-7PM, please contact the day team taking care of the patient using [email or web address]   Patient was evaluated in the context of the global COVID-19 pandemic, which necessitated consideration that the patient might be at risk for infection with the SARS-CoV-2 virus that causes COVID-19. Institutional protocols and algorithms that pertain to the evaluation of patients at risk for COVID-19 are in a state of rapid change based on information released by regulatory bodies including the CDC and federal and state organizations. These policies and algorithms were followed during the patient's care.

## 2020-05-29 NOTE — ED Notes (Signed)
Pt c/o tingling in the left leg starting at knee and extending to foot. States "it feels asleep". Pt also states that left arm also feels "off", but then report same sensation in both left and right arms with stimulation to the area. Pt also reported a brief pressure in chest but has completely resolved.

## 2020-05-29 NOTE — ED Notes (Signed)
Pt on cardiac, pulse ox and bp monitor. Pt states coming in due to cramping in her chest. Pt states her heart rate is normally in the 50s. Pt denies pain. Pt states daughter is on her way in to visit.

## 2020-05-29 NOTE — ED Notes (Signed)
admiting provider at bedside Pt provided with water and snacks

## 2020-05-29 NOTE — ED Notes (Signed)
Admit provider notified of pts heart rate of 38bpm

## 2020-05-29 NOTE — ED Notes (Signed)
Admit provider notified of pts current BP. No new orders. Pt on cardiac monitor, pulse ox and bp monitor.

## 2020-05-29 NOTE — ED Provider Notes (Signed)
[facility] Emergency Department Provider Note    First MD Initiated Contact with Patient 05/29/20 1624     (approximate)  I have reviewed the triage vital signs and the nursing notes.   HISTORY  Chief Complaint Dizziness and Weakness    HPI Becky Barnes is a 59 y.o. female below listed past medical history presents to the ER for evaluation of several days of intermittent generalized malaise fatigue some nausea and tingling sensation.  States that she did check her blood pressure several days ago noted that her blood pressure was low and that her heart rate was low as well when she was feeling very weak and fatigued.  She then rested and symptoms got better.  She denies any history of heart troubles.  She not have any chest pain or shortness of breath.  No numbness or tingling.  Does have generalized lightheadedness particular with standing.  No new medications but did recently decrease her blood pressure medications.    Past Medical History:  Diagnosis Date  . Hypertension    Family History  Problem Relation Age of Onset  . Pancreatic cancer Mother   . Diabetes Mother   . Hypertension Mother   . Congestive Heart Failure Mother   . Prostate cancer Father    Past Surgical History:  Procedure Laterality Date  . ABDOMINAL HYSTERECTOMY     There are no problems to display for this patient.     Prior to Admission medications   Medication Sig Start Date End Date Taking? Authorizing Provider  gabapentin (NEURONTIN) 300 MG capsule Take 1 capsule (300 mg total) by mouth 3 (three) times daily. 01/31/20   Wallis Bamberg, PA-C  lisinopril (ZESTRIL) 20 MG tablet Take 20 mg by mouth daily. 01/20/20   [provider]  traMADol (ULTRAM) 50 MG tablet Take 1 tablet (50 mg total) by mouth every 6 (six) hours as needed. 01/30/20   Wallis Bamberg, PA-C  valACYclovir (VALTREX) 1000 MG tablet Take 1 tablet (1,000 mg total) by mouth 3 (three) times daily. 01/30/20    Wallis Bamberg, PA-C  cetirizine (ZYRTEC) 10 MG tablet Take 1 tablet (10 mg total) by mouth daily. 12/01/19 01/23/20  Cuthriell, Delorise Royals, PA-C  fluticasone (FLONASE) 50 MCG/ACT nasal spray Place 1 spray into both nostrils 2 (two) times daily. 12/01/19 01/23/20  Cuthriell, Delorise Royals, PA-C  hydrochlorothiazide (HYDRODIURIL) 12.5 MG tablet Take by mouth. 12/22/18 01/23/20  [provider]    Allergies Patient has no known allergies.    Social History Social History   Tobacco Use  . Smoking status: Former Smoker    Quit date: 01/22/1990    Years since quitting: 30.3  . Smokeless tobacco: Never Used  Vaping Use  . Vaping Use: Never used  Substance Use Topics  . Alcohol use: Never  . Drug use: Never    Review of Systems Patient denies headaches, rhinorrhea, blurry vision, numbness, shortness of breath, chest pain, edema, cough, abdominal pain, nausea, vomiting, diarrhea, dysuria, fevers, rashes or hallucinations unless otherwise stated above in HPI. ____________________________________________   PHYSICAL EXAM:  VITAL SIGNS: Vitals:   05/29/20 1742 05/29/20 1819  BP:    Pulse: (!) 37 (!) 37  Resp: 15 15  Temp:    SpO2: 99% 100%    Constitutional: Alert and oriented.  Eyes: Conjunctivae are normal.  Head: Atraumatic. Nose: No congestion/rhinnorhea. Mouth/Throat: Mucous membranes are moist.   Neck: No stridor. Painless ROM.  Cardiovascular: Normal rate, regular rhythm. Grossly normal heart  sounds.  Good peripheral circulation. Respiratory: Normal respiratory effort.  No retractions. Lungs CTAB. Gastrointestinal: Soft and nontender. No distention. No abdominal bruits. No CVA tenderness. Genitourinary:  Musculoskeletal: No lower extremity tenderness nor edema.  No joint effusions. Neurologic:  Normal speech and language. No gross focal neurologic deficits are appreciated. No facial droop Skin:  Skin is warm, dry and intact. No rash noted. Psychiatric: Mood and affect  are normal. Speech and behavior are normal.  ____________________________________________   LABS (all labs ordered are listed, but only abnormal results are displayed)  Results for orders placed or performed during the hospital encounter of 05/29/20 (from the past 24 hour(s))  Urinalysis, Complete w Microscopic     Status: Abnormal   Collection Time: 05/29/20  1:00 PM  Result Value Ref Range   Color, Urine STRAW (A) YELLOW   APPearance CLEAR (A) CLEAR   Specific Gravity, Urine 1.006 1.005 - 1.030   pH 6.0 5.0 - 8.0   Glucose, UA NEGATIVE NEGATIVE mg/dL   Hgb urine dipstick SMALL (A) NEGATIVE   Bilirubin Urine NEGATIVE NEGATIVE   Ketones, ur NEGATIVE NEGATIVE mg/dL   Protein, ur NEGATIVE NEGATIVE mg/dL   Nitrite NEGATIVE NEGATIVE   Leukocytes,Ua NEGATIVE NEGATIVE   RBC / HPF 0-5 0 - 5 RBC/hpf   WBC, UA 0-5 0 - 5 WBC/hpf   Bacteria, UA NONE SEEN NONE SEEN   Squamous Epithelial / LPF NONE SEEN 0 - 5   Mucus PRESENT   CBC     Status: None   Collection Time: 05/29/20  1:58 PM  Result Value Ref Range   WBC 4.6 4.0 - 10.5 K/uL   RBC 4.36 3.87 - 5.11 MIL/uL   Hemoglobin 13.1 12.0 - 15.0 g/dL   HCT 29.9 36 - 46 %   MCV 91.1 80.0 - 100.0 fL   MCH 30.0 26.0 - 34.0 pg   MCHC 33.0 30.0 - 36.0 g/dL   RDW 24.2 68.3 - 41.9 %   Platelets 310 150 - 400 K/uL   nRBC 0.0 0.0 - 0.2 %  Basic metabolic panel     Status: None   Collection Time: 05/29/20  1:58 PM  Result Value Ref Range   Sodium 142 135 - 145 mmol/L   Potassium 4.4 3.5 - 5.1 mmol/L   Chloride 108 98 - 111 mmol/L   CO2 27 22 - 32 mmol/L   Glucose, Bld 99 70 - 99 mg/dL   BUN 10 8 - 23 mg/dL   Creatinine, Ser 6.22 0.44 - 1.00 mg/dL   Calcium 9.5 8.9 - 29.7 mg/dL   GFR calc non Af Amer >60 >60 mL/min   GFR calc Af Amer >60 >60 mL/min   Anion gap 7 5 - 15  Troponin I (High Sensitivity)     Status: None   Collection Time: 05/29/20  1:58 PM  Result Value Ref Range   Troponin I (High Sensitivity) 3 <18 ng/L  Hepatic function  panel     Status: Abnormal   Collection Time: 05/29/20  1:58 PM  Result Value Ref Range   Total Protein 7.6 6.5 - 8.1 g/dL   Albumin 4.4 3.5 - 5.0 g/dL   AST 31 15 - 41 U/L   ALT 47 (H) 0 - 44 U/L   Alkaline Phosphatase 56 38 - 126 U/L   Total Bilirubin 0.7 0.3 - 1.2 mg/dL   Bilirubin, Direct <9.8 0.0 - 0.2 mg/dL   Indirect Bilirubin NOT CALCULATED 0.3 - 0.9 mg/dL  TSH  Status: None   Collection Time: 05/29/20  1:58 PM  Result Value Ref Range   TSH 2.175 0.350 - 4.500 uIU/mL  Troponin I (High Sensitivity)     Status: None   Collection Time: 05/29/20  5:08 PM  Result Value Ref Range   Troponin I (High Sensitivity) 3 <18 ng/L   ____________________________________________  EKG My review and personal interpretation at Time: 12:21   Indication: weakness,  Rate: 45  Rhythm: sinus Axis: normal Other: normal intervals, no stemi ____________________________________________  RADIOLOGY  I personally reviewed all radiographic images ordered to evaluate for the above acute complaints and reviewed radiology reports and findings.  These findings were personally discussed with the patient.  Please see medical record for radiology report.  ____________________________________________   PROCEDURES  Procedure(s) performed:  Procedures    Critical Care performed: no ____________________________________________   INITIAL IMPRESSION / ASSESSMENT AND PLAN / ED COURSE  Pertinent labs & imaging results that were available during my care of the patient were reviewed by me and considered in my medical decision making (see chart for details).   DDX: Anemia, electrolyte abnormality, CVA, dehydration, hypothyroidism, bradycardia, ACS, CHF  Sangeeta Youse is a 59 y.o. who presents to the ED with lightheadedness dizziness generalized weakness.  Patient states that she is fairly active but reports that her resting heart rate typically in the 50s.  She does not any focal neuro deficits on  exam noted to have sinus with significant bradycardia.  Fortunately she is normotensive but I do suspect she is having symptoms related to significant bradycardia.  No medications that I see that could elicit the symptoms.  Thyroid normal.  No sign of acute ischemia.  No sign of anemia.  She is also reporting these episodes of palpitations question when she is having episodes of sick sinus syndrome.  Given her symptomatic bradycardia however her risk factors and age I do feel that she would benefit from observation the hospital for cardiology consultation and further work-up.  Will consult hospitalist for admission.     The patient was evaluated in Emergency Department today for the symptoms described in the history of present illness. He/she was evaluated in the context of the global COVID-19 pandemic, which necessitated consideration that the patient might be at risk for infection with the SARS-CoV-2 virus that causes COVID-19. Institutional protocols and algorithms that pertain to the evaluation of patients at risk for COVID-19 are in a state of rapid change based on information released by regulatory bodies including the CDC and federal and state organizations. These policies and algorithms were followed during the patient's care in the ED.  As part of my medical decision making, I reviewed the following data within the electronic MEDICAL RECORD NUMBER Nursing notes reviewed and incorporated, Labs reviewed, notes from prior ED visits and [place] Controlled Substance Database   ____________________________________________   FINAL CLINICAL IMPRESSION(S) / ED DIAGNOSES  Final diagnoses:  Symptomatic bradycardia      NEW MEDICATIONS STARTED DURING THIS VISIT:  New Prescriptions   No medications on file     Note:  This document was prepared using Dragon voice recognition software and may include unintentional dictation errors.    Willy Eddy, MD 05/29/20 [phone]

## 2020-05-29 NOTE — ED Triage Notes (Signed)
Pt states that her symptoms started on Wednesday, states that she has been feeling light headed, dizziness, weak, states not feeling well and having some pressure on her. Pt states that she checks her heart rate on her watch and has been capturing rates of 38 which is new, states that she is also having some intermittent flutters in her chest as well. Pt states that her bp medicine was decreased a while.

## 2020-05-30 ENCOUNTER — Encounter: Payer: Self-pay | Admitting: Internal Medicine

## 2020-05-30 ENCOUNTER — Observation Stay: Payer: Self-pay

## 2020-05-30 ENCOUNTER — Observation Stay
Admit: 2020-05-30 | Discharge: 2020-05-30 | Disposition: A | Discharge status: Home / Self Care | Payer: Self-pay | Attending: Internal Medicine | Admitting: Internal Medicine

## 2020-05-30 DIAGNOSIS — I10 Essential (primary) hypertension: Secondary | ICD-10-CM

## 2020-05-30 DIAGNOSIS — E876 Hypokalemia: Secondary | ICD-10-CM

## 2020-05-30 DIAGNOSIS — R001 Bradycardia, unspecified: Secondary | ICD-10-CM

## 2020-05-30 DIAGNOSIS — Z8616 Personal history of COVID-19: Secondary | ICD-10-CM

## 2020-05-30 LAB — COMPREHENSIVE METABOLIC PANEL
ALT: 36 U/L (ref 0–44)
AST: 24 U/L (ref 15–41)
Albumin: 3.5 g/dL (ref 3.5–5.0)
Alkaline Phosphatase: 54 U/L (ref 38–126)
Anion gap: 5 (ref 5–15)
BUN: 14 mg/dL (ref 8–23)
CO2: 25 mmol/L (ref 22–32)
Calcium: 8.3 mg/dL — ABNORMAL LOW (ref 8.9–10.3)
Chloride: 113 mmol/L — ABNORMAL HIGH (ref 98–111)
Creatinine, Ser: 0.81 mg/dL (ref 0.44–1.00)
GFR calc Af Amer: >60 mL/min (ref >60)
GFR calc non Af Amer: >60 mL/min (ref >60)
Glucose, Bld: 118 mg/dL — ABNORMAL HIGH (ref 70–99)
Potassium: 3.4 mmol/L — ABNORMAL LOW (ref 3.5–5.1)
Sodium: 143 mmol/L (ref 135–145)
Total Bilirubin: 0.5 mg/dL (ref 0.3–1.2)
Total Protein: 6.1 g/dL — ABNORMAL LOW (ref 6.5–8.1)

## 2020-05-30 LAB — CBC WITH DIFFERENTIAL/PLATELET
Abs Immature Granulocytes: 0.02 10*3/uL (ref 0.00–0.07)
Basophils Absolute: 0 10*3/uL (ref 0.0–0.1)
Basophils Relative: 1 %
Eosinophils Absolute: 0.2 10*3/uL (ref 0.0–0.5)
Eosinophils Relative: 3 %
HCT: 35.2 % — ABNORMAL LOW (ref 36.0–46.0)
Hemoglobin: 11.8 g/dL — ABNORMAL LOW (ref 12.0–15.0)
Immature Granulocytes: 0 %
Lymphocytes Relative: 31 %
Lymphs Abs: 1.9 10*3/uL (ref 0.7–4.0)
MCH: 30.7 pg (ref 26.0–34.0)
MCHC: 33.5 g/dL (ref 30.0–36.0)
MCV: 91.7 fL (ref 80.0–100.0)
Monocytes Absolute: 0.5 10*3/uL (ref 0.1–1.0)
Monocytes Relative: 8 %
Neutro Abs: 3.5 10*3/uL (ref 1.7–7.7)
Neutrophils Relative %: 57 %
Platelets: 278 10*3/uL (ref 150–400)
RBC: 3.84 MIL/uL — ABNORMAL LOW (ref 3.87–5.11)
RDW: 12.8 % (ref 11.5–15.5)
WBC: 6.1 10*3/uL (ref 4.0–10.5)
nRBC: 0 % (ref 0.0–0.2)

## 2020-05-30 LAB — PHOSPHORUS: Phosphorus: 4.3 mg/dL (ref 2.5–4.6)

## 2020-05-30 LAB — MAGNESIUM: Magnesium: 2.2 mg/dL (ref 1.7–2.4)

## 2020-05-30 LAB — HIV ANTIBODY (ROUTINE TESTING W REFLEX): HIV Screen 4th Generation wRfx: NONREACTIVE

## 2020-05-30 LAB — TSH: TSH: 2.197 u[IU]/mL (ref 0.350–4.500)

## 2020-05-30 MED ORDER — IOHEXOL 350 MG/ML SOLN
75.0000 mL | Freq: Once | INTRAVENOUS | Status: AC | PRN
  Administered 2020-05-30: 75 mL via INTRAVENOUS
  Filled 2020-05-30: qty 75

## 2020-05-30 MED ORDER — POTASSIUM CHLORIDE CRYS ER 20 MEQ PO TBCR
30.0000 meq | EXTENDED_RELEASE_TABLET | Freq: Two times a day (BID) | ORAL | Status: AC
  Administered 2020-05-30 (×2): 30 meq via ORAL
  Filled 2020-05-30: qty 2
  Filled 2020-05-30: qty 1

## 2020-05-30 NOTE — ED Notes (Signed)
Patient given breakfast tray and prune juice. Patient also given menu to order lunch

## 2020-05-30 NOTE — ED Notes (Signed)
Pt sleeping. 

## 2020-05-30 NOTE — ED Notes (Signed)
Report to jennifer, rn.  

## 2020-05-30 NOTE — Progress Notes (Signed)
*  PRELIMINARY RESULTS* Echocardiogram 2D Echocardiogram has been performed.  Becky Barnes 05/30/2020, 8:45 PM

## 2020-05-30 NOTE — ED Notes (Signed)
Patient given toothbrush, toothpaste, body wash/soap, and incontinent cleaners, deodorant, and soap. Patient also given new gown, wash cloths, and towels. Patient able to wash self independently

## 2020-05-30 NOTE — ED Notes (Signed)
Secure chat message sent to dr. Adela Glimpse to notify of elevated dimer. Per dr. Adela Glimpse will continue to monitor.

## 2020-05-30 NOTE — Progress Notes (Signed)
PROGRESS NOTE    Becky Barnes  CWC:[id number] DOB: May 12, 1961 DOA: 05/29/2020 PCP: West Carbo, MD    Brief Narrative:  Becky Barnes is a 59 year old female with past medical history notable for Covid-19 infection January 2021, hyperlipidemia, herpes zoster, essential hypertension who presented to the ER with complaints of lightheaded/dizziness.  She also noted that her heart rate was low at 38 which she was monitoring with her smart watch.  She also reported palpitations with fluttering and generalized fatigue.  She also reports that her blood pressure as of late was running low.  In the ER, patient was noted to be bradycardic down to the upper 30s.  EKG with heart rate 45.  TSH was within normal limits.  EKG with nonischemic findings.  ED physician discussed with cardiology and requested hospitalist admission for further evaluation and observation.   Assessment & Plan:   Active Problems:   Symptomatic bradycardia   Essential hypertension   Hyperlipemia   Bradycardia   Symptomatic bradycardia Patient presenting from home with dizziness, fatigue and noted low heart rates in the 30s while she was monitoring on her smart watch.  She is also noted that her blood pressure has been low lately and she has been holding her home antihypertensives.  Patient is not on a beta-blocker outpatient.  Troponin III, within normal limits.  TSH 2.175, within normal limits.  CT head with no acute intracranial findings.  Chest x-ray unrevealing. --Cardiology following, appreciate assistance --Echocardiogram: Pending --Continue to monitor on telemetry  Hypokalemia Potassium 3.4, magnesium 2.3. --Repleted potassium today --Repeat electrolytes in the a.m.  Essential hypertension On lisinopril 20 mg p.o. daily at home. --BP 113/69, well controlled. --Continue to hold home antihypertensives --Continue to monitor blood pressure closely  Hyperlipidemia --Atorvastatin 40 mg p.o.  daily  Elevated D-dimer D-dimer elevated at 760.  Bilateral duplex ultrasound lower extremities negative for DVT.  CTPA negative for pulmonary embolism.   DVT prophylaxis: SCDs Code Status: Full code Family Communication: No family present at bedside  Disposition Plan:  Status is: Observation  The patient remains OBS appropriate and will d/c before 2 midnights.  Dispo: The patient is from: Home              Anticipated d/c is to: Home              Anticipated d/c date is: 1 day              Patient currently is not medically stable to d/c.   Consultants:   Cardiology  Procedures:   TTE: Pending  Antimicrobials:   None   Subjective: Patient seen and examined bedside, resting comfortably.  Observed walking to restroom without any recurrent dizziness or symptoms.  States over the last few days was having dizziness, weakness and fatigue.  Has been holding her home antihypertensives.  Is not currently on a beta-blocker outpatient.  Concerned about her elevated D-dimer and requesting further imaging studies to rule out embolism; which were both negative.  No other questions or concerns at this time.  Cardiology following and wants to continue conservative management with monitoring telemetry and awaiting echocardiogram.  Denies current headache, no fever/chills/night sweats, no nausea/vomiting/diarrhea, no chest pain, no palpitations, no shortness of breath, no abdominal pain.  No acute events overnight per nursing staff.  Objective: Vitals:   05/30/20 0600 05/30/20 0800 05/30/20 0904 05/30/20 1000  BP: 113/69 (!) 136/75 124/69 125/72  Pulse: (!) 40 45 46 46  Resp: 16 15 13  19  Temp:      TempSrc:      SpO2: 98% 100% 99% 100%  Weight:      Height:        Intake/Output Summary (Last 24 hours) at 05/30/2020 1128 Last data filed at 05/29/2020 1907 Gross per 24 hour  Intake 502.31 ml  Output --  Net 502.31 ml   Filed Weights   05/29/20 1243  Weight: 77.1 kg     Examination:  General exam: Appears calm and comfortable  Respiratory system: Clear to auscultation. Respiratory effort normal.  Oxygenating well on room air Cardiovascular system: S1 & S2 heard, RRR. No JVD, murmurs, rubs, gallops or clicks. No pedal edema. Gastrointestinal system: Abdomen is nondistended, soft and nontender. No organomegaly or masses felt. Normal bowel sounds heard. Central nervous system: Alert and oriented. No focal neurological deficits. Extremities: Symmetric 5 x 5 power. Skin: No rashes, lesions or ulcers Psychiatry: Judgement and insight appear normal. Mood & affect appropriate.     Data Reviewed: I have personally reviewed following labs and imaging studies  CBC: Recent Labs  Lab 05/29/20 1358 05/30/20 0656  WBC 4.6 6.1  NEUTROABS  --  3.5  HGB 13.1 11.8*  HCT 39.7 35.2*  MCV 91.1 91.7  PLT 310 278   Basic Metabolic Panel: Recent Labs  Lab 05/29/20 1358 05/29/20 1928 05/30/20 0656  NA 142  --  143  K 4.4  --  3.4*  CL 108  --  113*  CO2 27  --  25  GLUCOSE 99  --  118*  BUN 10  --  14  CREATININE 0.88  --  0.81  CALCIUM 9.5  --  8.3*  MG  --  2.3 2.2  PHOS  --  3.3 4.3   GFR: Estimated Creatinine Clearance: 74.8 mL/min (by C-G formula based on SCr of 0.81 mg/dL). Liver Function Tests: Recent Labs  Lab 05/29/20 1358 05/30/20 0656  AST 31 24  ALT 47* 36  ALKPHOS 56 54  BILITOT 0.7 0.5  PROT 7.6 6.1*  ALBUMIN 4.4 3.5   No results for input(s): LIPASE, AMYLASE in the last 168 hours. No results for input(s): AMMONIA in the last 168 hours. Coagulation Profile: No results for input(s): INR, PROTIME in the last 168 hours. Cardiac Enzymes: Recent Labs  Lab 05/29/20 1928  CKTOTAL 90   BNP (last 3 results) No results for input(s): PROBNP in the last 8760 hours. HbA1C: No results for input(s): HGBA1C in the last 72 hours. CBG: No results for input(s): GLUCAP in the last 168 hours. Lipid Profile: No results for input(s):  CHOL, HDL, LDLCALC, TRIG, CHOLHDL, LDLDIRECT in the last 72 hours. Thyroid Function Tests: Recent Labs    05/30/20 0656  TSH 2.197   Anemia Panel: No results for input(s): VITAMINB12, FOLATE, FERRITIN, TIBC, IRON, RETICCTPCT in the last 72 hours. Sepsis Labs: No results for input(s): PROCALCITON, LATICACIDVEN in the last 168 hours.  Recent Results (from the past 240 hour(s))  SARS Coronavirus 2 by RT PCR (hospital order, performed in [facility] hospital lab) Nasopharyngeal Nasopharyngeal Swab     Status: None   Collection Time: 05/29/20  5:08 PM   Specimen: Nasopharyngeal Swab  Result Value Ref Range Status   SARS Coronavirus 2 NEGATIVE NEGATIVE Final    Comment: (NOTE) SARS-CoV-2 target nucleic acids are NOT DETECTED.  The SARS-CoV-2 RNA is generally detectable in upper and lower respiratory specimens during the acute phase of infection. The lowest concentration of SARS-CoV-2 viral  copies this assay can detect is 250 copies / mL. A negative result does not preclude SARS-CoV-2 infection and should not be used as the sole basis for treatment or other patient management decisions.  A negative result may occur with improper specimen collection / handling, submission of specimen other than nasopharyngeal swab, presence of viral mutation(s) within the areas targeted by this assay, and inadequate number of viral copies (<250 copies / mL). A negative result must be combined with clinical observations, patient history, and epidemiological information.  Fact Sheet for Patients:   BoilerBrush.com.cyhttps://www.fda.gov/media/136312/download  Fact Sheet for Healthcare Providers: https://pope.com/https://www.fda.gov/media/136313/download  This test is not yet approved or  cleared by the [place] FDA and has been authorized for detection and/or diagnosis of SARS-CoV-2 by FDA under an Emergency Use Authorization (EUA).  This EUA will remain in effect (meaning this test can be used) for the duration of the COVID-19  declaration under Section 564(b)(1) of the Act, 21 U.S.C. section 360bbb-3(b)(1), unless the authorization is terminated or revoked sooner.  Performed at [facility], [address] [place], [place] [address]          Radiology Studies: CT HEAD WO CONTRAST  Result Date: 05/29/2020 CLINICAL DATA:  59 year old female with history of dizziness and lightheadedness. Weakness. EXAM: CT HEAD WITHOUT CONTRAST TECHNIQUE: Contiguous axial images were obtained from the base of the skull through the vertex without intravenous contrast. COMPARISON:  No priors. FINDINGS: Brain: No evidence of acute infarction, hemorrhage, hydrocephalus, extra-axial collection or mass lesion/mass effect. Vascular: No hyperdense vessel or unexpected calcification. Skull: Normal. Negative for fracture or focal lesion. Sinuses/Orbits: No acute finding. Other: None. IMPRESSION: 1. No acute intracranial abnormalities. The appearance of the brain is normal for age. Electronically Signed   By: Trudie Reedaniel  Entrikin M.D.   On: 05/29/2020 13:22   CT ANGIO CHEST PE W OR WO CONTRAST  Result Date: 05/30/2020 CLINICAL DATA:  Positive D-dimer, dizziness.  Hypotension. EXAM: CT ANGIOGRAPHY CHEST WITH CONTRAST TECHNIQUE: Multidetector CT imaging of the chest was performed using the standard protocol during bolus administration of intravenous contrast. Multiplanar CT image reconstructions and MIPs were obtained to evaluate the vascular anatomy. CONTRAST:  75mL OMNIPAQUE IOHEXOL 350 MG/ML SOLN COMPARISON:  None. FINDINGS: Cardiovascular: Negative for pulmonary embolus. Atherosclerotic calcification of the aorta and coronary arteries. Heart is enlarged. No pericardial effusion. Mediastinum/Nodes: Thyroid may be slightly enlarged and heterogeneous. No pathologically enlarged mediastinal, hilar or axillary lymph nodes. Esophagus is unremarkable. Lungs/Pleura: Calcified granuloma in the left upper lobe. No pleural fluid. Airway is  unremarkable. Upper Abdomen: Visualized portions of the liver, gallbladder, adrenal glands, kidneys, spleen, pancreas, stomach and bowel are grossly unremarkable. Musculoskeletal: Degenerative changes in the spine. No worrisome lytic or sclerotic lesions. Review of the MIP images confirms the above findings. IMPRESSION: 1. Negative for pulmonary embolus. 2. Aortic atherosclerosis (ICD10-I70.0). Coronary artery calcification. Electronically Signed   By: Leanna BattlesMelinda  Blietz M.D.   On: 05/30/2020 10:23   US Venous Img Lower Bilateral (DVT)  Result Date: 05/30/2020 CLINICAL DATA:  59 year old female with positive D-dimer EXAM: BILATERAL LOWER EXTREMITY VENOUS DOPPLER ULTRASOUND TECHNIQUE: Gray-scale sonography with graded compression, as well as color Doppler and duplex ultrasound were performed to evaluate the lower extremity deep venous systems from the level of the common femoral vein and including the common femoral, femoral, profunda femoral, popliteal and calf veins including the posterior tibial, peroneal and gastrocnemius veins when visible. The superficial great saphenous vein was also interrogated. Spectral Doppler was utilized to evaluate flow  at rest and with distal augmentation maneuvers in the common femoral, femoral and popliteal veins. COMPARISON:  None. FINDINGS: RIGHT LOWER EXTREMITY Common Femoral Vein: No evidence of thrombus. Normal compressibility, respiratory phasicity and response to augmentation. Saphenofemoral Junction: No evidence of thrombus. Normal compressibility and flow on color Doppler imaging. Profunda Femoral Vein: No evidence of thrombus. Normal compressibility and flow on color Doppler imaging. Femoral Vein: No evidence of thrombus. Normal compressibility, respiratory phasicity and response to augmentation. Popliteal Vein: No evidence of thrombus. Normal compressibility, respiratory phasicity and response to augmentation. Calf Veins: No evidence of thrombus. Normal compressibility  and flow on color Doppler imaging. Superficial Great Saphenous Vein: No evidence of thrombus. Normal compressibility. Venous Reflux:  None. Other Findings:  None. LEFT LOWER EXTREMITY Common Femoral Vein: No evidence of thrombus. Normal compressibility, respiratory phasicity and response to augmentation. Saphenofemoral Junction: No evidence of thrombus. Normal compressibility and flow on color Doppler imaging. Profunda Femoral Vein: No evidence of thrombus. Normal compressibility and flow on color Doppler imaging. Femoral Vein: No evidence of thrombus. Normal compressibility, respiratory phasicity and response to augmentation. Popliteal Vein: No evidence of thrombus. Normal compressibility, respiratory phasicity and response to augmentation. Calf Veins: No evidence of thrombus. Normal compressibility and flow on color Doppler imaging. Superficial Great Saphenous Vein: No evidence of thrombus. Normal compressibility. Venous Reflux:  None. Other Findings:  None. IMPRESSION: No evidence of deep venous thrombosis in either lower extremity. Electronically Signed   By: Malachy Moan M.D.   On: 05/30/2020 10:58   DG Chest Portable 1 View  Result Date: 05/29/2020 CLINICAL DATA:  59 year old female with weakness. EXAM: PORTABLE CHEST 1 VIEW COMPARISON:  None. FINDINGS: No focal consolidation, pleural effusion, pneumothorax. Top-normal cardiac size. No acute osseous pathology. IMPRESSION: No active disease. Electronically Signed   By: Elgie Collard M.D.   On: 05/29/2020 17:05        Scheduled Meds: . atorvastatin  40 mg Oral Daily  . docusate sodium  100 mg Oral BID  . potassium chloride  30 mEq Oral BID  . sodium chloride flush  3 mL Intravenous Q12H   Continuous Infusions:   LOS: 0 days    Time spent: 38 minutes spent on chart review, discussion with nursing staff, consultants, updating family and interview/physical exam; more than 50% of that time was spent in counseling and/or coordination of  care.    Alvira Philips [place], DO [facility] Available via Epic secure chat 7am-7pm After these hours, please refer to coverage provider listed on [email or web address] 05/30/2020, 11:28 AM

## 2020-05-30 NOTE — Consult Note (Signed)
Cardiology Consultation:   Patient ID: Becky Barnes; [id number]; 1961/03/01   Admit date: 05/29/2020 Date of Consult: 05/30/2020  Primary Care Provider: West Carbo, MD Primary Cardiologist: New to [facility] - consult by Kirke Corin Primary Electrophysiologist:  None   Patient Profile:   Becky Barnes is a 59 y.o. female with a hx of COVID-19 in 11/2019 and HTN who is being seen today for the evaluation of bradycardia at the request of Dr. Adela Glimpse.  History of Present Illness:   Ms. Cloe no previously cardiac history. She was diagnosed with COVID-19 in 11/2019 and did not require hospitalization. Since her diagnosis, she has subsequently been fully vaccinated. She reports a baseline heart rate in the 50s bpm, not on AV nodal blocking medications. She has a history of hypertension that has been well controlled with lisinopril and HCTZ. Her antihypertensive regimen was recently decreased. She was in her usual state of health up until 05/25/2020. She was in the pool, swimming some laps. At the end of her work out, she was floating on her back when she suddenly "did not feel well." She describes this sensation as fatigue, nausea, and generalized malaise. She was able to get out of the pool and sit down. This was followed by rigors. She notes these symptoms persisted throughout the day on 7/21 and into 7/22. Since then, she has noted her Apple Watch has been telling her that her heart rate has been in the 30s bpm. Her symptoms have been intermittent and she has not always been symptomatic when she notes a bradycardic heart rate. There has been no chest pain, dyspnea, presyncope, or syncope. She does not a band-like pressure around her head with the above. She reports an episode of hypotension with BP in the 90s systolic during an episode of bradycardia. No family history of conduction disease. Due to persistent symptoms, she presented to [facility] where she was noted to be in sinus  bradycardia with heart rates in the 40s bpm with stable BP. She is not on any AV nodal blocking medications. TSH normal. Potassium 4.4-->3.4. HS-Tn negative x 2. D-dimer mildly elevated. CT head not acute. CXR not acute.    Past Medical History:  Diagnosis Date   COVID-19 11/2019   Hypertension     Past Surgical History:  Procedure Laterality Date   ABDOMINAL HYSTERECTOMY       Home Meds: Prior to Admission medications   Medication Sig Start Date End Date Taking? Authorizing Provider  atorvastatin (LIPITOR) 40 MG tablet Take 40 mg by mouth daily. 05/19/20  Yes [provider]  Cholecalciferol 50 MCG (2000 UT) CAPS Take by mouth.   Yes [provider]  lisinopril (ZESTRIL) 20 MG tablet Take 20 mg by mouth daily. 01/20/20  Yes [provider]  cetirizine (ZYRTEC) 10 MG tablet Take 1 tablet (10 mg total) by mouth daily. 12/01/19 01/23/20  Cuthriell, Delorise Royals, PA-C  fluticasone (FLONASE) 50 MCG/ACT nasal spray Place 1 spray into both nostrils 2 (two) times daily. 12/01/19 01/23/20  Cuthriell, Delorise Royals, PA-C  hydrochlorothiazide (HYDRODIURIL) 12.5 MG tablet Take by mouth. 12/22/18 01/23/20  [provider]    Inpatient Medications: Scheduled Meds:  atorvastatin  40 mg Oral Daily   docusate sodium  100 mg Oral BID   sodium chloride flush  3 mL Intravenous Q12H   Continuous Infusions:  PRN Meds: acetaminophen **OR** acetaminophen, HYDROcodone-acetaminophen, ondansetron **OR** ondansetron (ZOFRAN) IV  Allergies:  No Known Allergies  Social History:  Social History   Socioeconomic History   Marital status: Single    Spouse name: Not on file   Number of children: Not on file   Years of education: Not on file   Highest education level: Not on file  Occupational History   Not on file  Tobacco Use   Smoking status: Former Smoker    Quit date: 01/22/1990    Years since quitting: 30.3   Smokeless tobacco: Never Used  Vaping Use    Vaping Use: Never used  Substance and Sexual Activity   Alcohol use: Never   Drug use: Never   Sexual activity: Not on file  Other Topics Concern   Not on file  Social History Narrative   Not on file   Social Determinants of Health   Financial Resource Strain:    Difficulty of Paying Living Expenses:   Food Insecurity:    Worried About Programme researcher, broadcasting/film/video in the Last Year:    Barista in the Last Year:   Transportation Needs:    Freight forwarder (Medical):    Lack of Transportation (Non-Medical):   Physical Activity:    Days of Exercise per Week:    Minutes of Exercise per Session:   Stress:    Feeling of Stress :   Social Connections:    Frequency of Communication with Friends and Family:    Frequency of Social Gatherings with Friends and Family:    Attends Religious Services:    Active Member of Clubs or Organizations:    Attends Engineer, structural:    Marital Status:   Intimate Partner Violence:    Fear of Current or Ex-Partner:    Emotionally Abused:    Physically Abused:    Sexually Abused:      Family History:   Family History  Problem Relation Age of Onset   Pancreatic cancer Mother    Diabetes Mother    Hypertension Mother    Congestive Heart Failure Mother    Prostate cancer Father     ROS:  Review of Systems  Constitutional: Positive for malaise/fatigue. Negative for chills, diaphoresis, fever and weight loss.  HENT: Negative for congestion.   Eyes: Negative for discharge and redness.  Respiratory: Negative for cough, sputum production, shortness of breath and wheezing.   Cardiovascular: Negative for chest pain, palpitations, orthopnea, claudication, leg swelling and PND.  Gastrointestinal: Positive for nausea. Negative for abdominal pain, heartburn and vomiting.  Musculoskeletal: Negative for falls and myalgias.  Skin: Negative for rash.  Neurological: Positive for dizziness, weakness and  headaches. Negative for tingling, tremors, sensory change, speech change, focal weakness, seizures and loss of consciousness.  Endo/Heme/Allergies: Does not bruise/bleed easily.  Psychiatric/Behavioral: Negative for substance abuse. The patient is not nervous/anxious.   All other systems reviewed and are negative.     Physical Exam/Data:   Vitals:   05/30/20 0430 05/30/20 0500 05/30/20 0530 05/30/20 0600  BP: 123/73 125/74 123/73 113/69  Pulse: (!) 44 (!) 43 47 (!) 40  Resp:  15  16  Temp:      TempSrc:      SpO2: 97% 98% 97% 98%  Weight:      Height:        Intake/Output Summary (Last 24 hours) at 05/30/2020 0926 Last data filed at 05/29/2020 1907 Gross per 24 hour  Intake 502.31 ml  Output --  Net 502.31 ml   Filed Weights   05/29/20 1243  Weight: 77.1 kg  Body mass index is 28.29 kg/m.   Physical Exam: General: Well developed, well nourished, in no acute distress. Head: Normocephalic, atraumatic, sclera non-icteric, no xanthomas, nares without discharge.  Neck: Negative for carotid bruits. JVD not elevated. Lungs: Clear bilaterally to auscultation without wheezes, rales, or rhonchi. Breathing is unlabored. Heart: Bradycardic with S1 S2. No murmurs, rubs, or gallops appreciated. Abdomen: Soft, non-tender, non-distended with normoactive bowel sounds. No hepatomegaly. No rebound/guarding. No obvious abdominal masses. Msk:  Strength and tone appear normal for age. Extremities: No clubbing or cyanosis. No edema. Distal pedal pulses are 2+ and equal bilaterally. Neuro: Alert and oriented X 3. No facial asymmetry. No focal deficit. Moves all extremities spontaneously. Psych:  Responds to questions appropriately with a normal affect.   EKG:  The EKG was personally reviewed and demonstrates: sinus bradycardia, 45 bpm, poor R wave progression along the precordial leads, no acute st/e changes Telemetry:  Telemetry was personally reviewed and demonstrates: sinus bradycardia with  sinus arrhythmia, 40s bpm, no evidence of high-grade AV block  Weights: Filed Weights   05/29/20 1243  Weight: 77.1 kg    Relevant CV Studies:  2D echo pending  Laboratory Data:  Chemistry Recent Labs  Lab 05/29/20 1358 05/30/20 0656  NA 142 143  K 4.4 3.4*  CL 108 113*  CO2 27 25  GLUCOSE 99 118*  BUN 10 14  CREATININE 0.88 0.81  CALCIUM 9.5 8.3*  GFRNONAA >60 >60  GFRAA >60 >60  ANIONGAP 7 5    Recent Labs  Lab 05/29/20 1358 05/30/20 0656  PROT 7.6 6.1*  ALBUMIN 4.4 3.5  AST 31 24  ALT 47* 36  ALKPHOS 56 54  BILITOT 0.7 0.5   Hematology Recent Labs  Lab 05/29/20 1358 05/30/20 0656  WBC 4.6 6.1  RBC 4.36 3.84*  HGB 13.1 11.8*  HCT 39.7 35.2*  MCV 91.1 91.7  MCH 30.0 30.7  MCHC 33.0 33.5  RDW 12.8 12.8  PLT 310 278   Cardiac EnzymesNo results for input(s): TROPONINI in the last 168 hours. No results for input(s): TROPIPOC in the last 168 hours.  BNP Recent Labs  Lab 05/29/20 1358  BNP 97.8    DDimer No results for input(s): DDIMER in the last 168 hours.  Radiology/Studies:  CT HEAD WO CONTRAST  Result Date: 05/29/2020 IMPRESSION: 1. No acute intracranial abnormalities. The appearance of the brain is normal for age. Electronically Signed   By: Trudie Reed M.D.   On: 05/29/2020 13:22   DG Chest Portable 1 View  Result Date: 05/29/2020 IMPRESSION: No active disease. Electronically Signed   By: Elgie Collard M.D.   On: 05/29/2020 17:05    Assessment and Plan:   1. Sinus bradycardia: -She reports a baseline heart rate in the 50s bpm -Currently in sinus bradycardia with rates in the 40s bpm -No evidence of high-grade AV block to date -Monitor on telemetry  -Outpatient cardiac monitoring  -Not on any AV nodal blocking medications -TSH normal -Potasium 4.4 upon ED presentation -Obtain echo  -Ambulate to assess for appropriate chronotropic response -BP stable -No indication for atropine, Dopamine, venous temp wire, or emergent  PPM at this time  2. 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 Hypokalemia: -Replete potassium to goal 4.0  4. HTN: -Blood pressure well controlled -Holding lisinopril and HCTZ for now  For questions or updates, please  contact [facility] Please consult [email or web address] for contact info under Cardiology/STEMI.   Signed, Eula Listenyan Mars Scheaffer, PA-C [facility] Pager: [phone] 05/30/2020, 9:26 AM

## 2020-05-30 NOTE — ED Notes (Signed)
Discussed am orthostatic vital signs order. Will wait until pt is more awake to complete.

## 2020-05-31 DIAGNOSIS — R42 Dizziness and giddiness: Secondary | ICD-10-CM

## 2020-05-31 DIAGNOSIS — R531 Weakness: Secondary | ICD-10-CM

## 2020-05-31 DIAGNOSIS — E782 Mixed hyperlipidemia: Secondary | ICD-10-CM

## 2020-05-31 LAB — ECHOCARDIOGRAM COMPLETE
AR max vel: 2.66 cm2
AV Peak grad: 10.1 mmHg
Ao pk vel: 1.59 m/s
Area-P 1/2: 2.66 cm2
Height: 65 in
S' Lateral: 3.38 cm
Weight: 2720 oz

## 2020-05-31 LAB — BASIC METABOLIC PANEL
Anion gap: 6 (ref 5–15)
BUN: 12 mg/dL (ref 8–23)
CO2: 26 mmol/L (ref 22–32)
Calcium: 9.1 mg/dL (ref 8.9–10.3)
Chloride: 108 mmol/L (ref 98–111)
Creatinine, Ser: 0.79 mg/dL (ref 0.44–1.00)
GFR calc Af Amer: >60 mL/min (ref >60)
GFR calc non Af Amer: >60 mL/min (ref >60)
Glucose, Bld: 94 mg/dL (ref 70–99)
Potassium: 4.2 mmol/L (ref 3.5–5.1)
Sodium: 140 mmol/L (ref 135–145)

## 2020-05-31 LAB — MAGNESIUM: Magnesium: 2.3 mg/dL (ref 1.7–2.4)

## 2020-05-31 LAB — PROTIME-INR
INR: 1.1 (ref 0.8–1.2)
Prothrombin Time: 13.4 seconds (ref 11.4–15.2)

## 2020-05-31 LAB — GLUCOSE, CAPILLARY: Glucose-Capillary: 94 mg/dL (ref 70–99)

## 2020-05-31 NOTE — Plan of Care (Signed)

## 2020-05-31 NOTE — Progress Notes (Signed)
Patient discharged instructions have been  reviewed with patient with many questions and clarifications about instructions. All questions answered and patient verbalizes understanding of instructions. Please see Dr., [place] and Dr. Shea Evans notes. Patient discharged via wheelchair accompanied by NT. Patient remains alert and oriented X 4.

## 2020-05-31 NOTE — Plan of Care (Signed)

## 2020-05-31 NOTE — Progress Notes (Signed)
Progress Note  Patient Name: Becky Barnes Date of Encounter: 05/31/2020  Primary Cardiologist: New to [facility] - consult by Kirke Corin  Subjective   Feels well this morning. No dizziness, chest pain, palpitations, or dyspnea. She has demonstrated appropriate chronotropic response with ambulation in the room with a heart rate of 77 bpm. BP not as soft off HCTZ and lisinopril. Potassium repleted.   Inpatient Medications    Scheduled Meds: . atorvastatin  40 mg Oral Daily  . docusate sodium  100 mg Oral BID  . sodium chloride flush  3 mL Intravenous Q12H   Continuous Infusions:  PRN Meds: acetaminophen **OR** acetaminophen, HYDROcodone-acetaminophen, ondansetron **OR** ondansetron (ZOFRAN) IV   Vital Signs    Vitals:   05/30/20 1647 05/30/20 1926 05/31/20 0404 05/31/20 0730  BP: 128/75 (!) 144/87 121/72 (!) 114/64  Pulse: 49 51 (!) 41 (!) 39  Resp: 19 19 19 18   Temp: 98.4 F (36.9 C) 98.5 F (36.9 C) (!) 97.5 F (36.4 C) 98.4 F (36.9 C)  TempSrc: Oral Oral Oral   SpO2:  100% 100% 100%  Weight:   78.6 kg   Height:        Intake/Output Summary (Last 24 hours) at 05/31/2020 1129 Last data filed at 05/31/2020 0930 Gross per 24 hour  Intake 240 ml  Output 1700 ml  Net -1460 ml   Filed Weights   05/29/20 1243 05/31/20 0404  Weight: 77.1 kg 78.6 kg    Telemetry    Sinus bradycardia with an average rate in the mid 50s bpm, heart rates will drop into the 30s bpm overnight. She has demonstrated appropriate chronotropic response with heart rate of 77 bpm with ambulation in the room - Personally Reviewed  ECG    No new tracings - Personally Reviewed  Physical Exam   GEN: No acute distress.   Neck: No JVD. Cardiac: Bradycardic, no murmurs, rubs, or gallops.  Respiratory: Clear to auscultation bilaterally.  GI: Soft, nontender, non-distended.   MS: No edema; No deformity. Neuro:  Alert and oriented x 3; Nonfocal.  Psych: Normal affect.  Labs     Chemistry Recent Labs  Lab 05/29/20 1358 05/30/20 0656 05/31/20 0530  NA 142 143 140  K 4.4 3.4* 4.2  CL 108 113* 108  CO2 27 25 26   GLUCOSE 99 118* 94  BUN 10 14 12   CREATININE 0.88 0.81 0.79  CALCIUM 9.5 8.3* 9.1  PROT 7.6 6.1*  --   ALBUMIN 4.4 3.5  --   AST 31 24  --   ALT 47* 36  --   ALKPHOS 56 54  --   BILITOT 0.7 0.5  --   GFRNONAA >60 >60 >60  GFRAA >60 >60 >60  ANIONGAP 7 5 6      Hematology Recent Labs  Lab 05/29/20 1358 05/30/20 0656  WBC 4.6 6.1  RBC 4.36 3.84*  HGB 13.1 11.8*  HCT 39.7 35.2*  MCV 91.1 91.7  MCH 30.0 30.7  MCHC 33.0 33.5  RDW 12.8 12.8  PLT 310 278    Cardiac EnzymesNo results for input(s): TROPONINI in the last 168 hours. No results for input(s): TROPIPOC in the last 168 hours.   BNP Recent Labs  Lab 05/29/20 1358  BNP 97.8     DDimer No results for input(s): DDIMER in the last 168 hours.   Radiology    CT HEAD WO CONTRAST  Result Date: 05/29/2020 IMPRESSION: 1. No acute intracranial abnormalities. The appearance of the brain is normal  for age. Electronically Signed   By: Trudie Reed M.D.   On: 05/29/2020 13:22   CT ANGIO CHEST PE W OR WO CONTRAST  Result Date: 05/30/2020 IMPRESSION: 1. Negative for pulmonary embolus. 2. Aortic atherosclerosis (ICD10-I70.0). Coronary artery calcification. Electronically Signed   By: Leanna Battles M.D.   On: 05/30/2020 10:23   US Venous Img Lower Bilateral (DVT)  Result Date: 05/30/2020 IMPRESSION: No evidence of deep venous thrombosis in either lower extremity. Electronically Signed   By: Malachy Moan M.D.   On: 05/30/2020 10:58   DG Chest Portable 1 View  Result Date: 05/29/2020 IMPRESSION: No active disease. Electronically Signed   By: Elgie Collard M.D.   On: 05/29/2020 17:05    Cardiac Studies   2D echo pending -Preliminary report with preserved LVSF  Patient Profile     59 y.o. female with history of COVID-19 in 11/2019 and HTN who is being seen today for  the evaluation of bradycardia.  Assessment & Plan    1. Sinus bradycardia: -She reports a baseline heart rate in the 50s bpm -Currently in sinus bradycardia with rates in the 40s bpm -No evidence of high-grade AV block to date on telemetry  -Consider outpatient cardiac monitoring, can be mailed if she would like, currently she defers this -Not on any AV nodal blocking medications -Orthostatic vitals normal -TSH normal -Echo pending with preliminary report showing preserved LVSF -She has demonstrated appropriate chronotropic response with ambulation in the room -Ambulate in the hall, if she tolerates this well, from our perspective she can be discharged  -BP stable -No indication for atropine, Dopamine, venous temp wire, or emergent PPM at this time -Multiple ED/urgent care visits for chronic fatigue   2. 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 Hypokalemia: -Repleted   4. HTN: -Blood pressure was soft coming in, though improved off lisinopril and HCTZ -Soft BP may have been contributed to her presentation -Can resume lisinopril at lower dose 10 mg daily  -Hold HCTZ   For questions or updates, please contact [facility] Please consult [email or web address] for contact info under Cardiology/STEMI.    Signed, Eula Listen, PA-C [facility] Pager: [phone] 05/31/2020, 11:29 AM

## 2020-05-31 NOTE — Discharge Instructions (Signed)
Bradycardia, Adult Bradycardia is a slower-than-normal heartbeat. A normal resting heart rate for an adult ranges from 60 to 100 beats per minute. With bradycardia, the resting heart rate is less than 60 beats per minute. Bradycardia can prevent enough oxygen from reaching certain areas of your body when you are active. It can be serious if it keeps enough oxygen from reaching your brain and other parts of your body. Bradycardia is not a problem for everyone. For some healthy adults, a slow resting heart rate is normal. What are the causes? This condition may be caused by:  A problem with the heart, including: ? A problem with the heart's electrical system, such as a heart block. With a heart block, electrical signals between the chambers of the heart are partially or completely blocked, so they are not able to work as they should. ? A problem with the heart's natural pacemaker (sinus node). ? Heart disease. ? A heart attack. ? Heart damage. ? Lyme disease. ? A heart infection. ? A heart condition that is present at birth (congenital heart defect).  Certain medicines that treat heart conditions.  Certain conditions, such as hypothyroidism and obstructive sleep apnea.  Problems with the balance of chemicals and other substances, like potassium, in the blood.  Trauma.  Radiation therapy. What increases the risk? You are more likely to develop this condition if you:  Are age 65 or older.  Have high blood pressure (hypertension), high cholesterol (hyperlipidemia), or diabetes.  Drink heavily, use tobacco or nicotine products, or use drugs. What are the signs or symptoms? Symptoms of this condition include:  Light-headedness.  Feeling faint or fainting.  Fatigue and weakness.  Trouble with activity or exercise.  Shortness of breath.  Chest pain (angina).  Drowsiness.  Confusion.  Dizziness. How is this diagnosed? This condition may be diagnosed based on:  Your  symptoms.  Your medical history.  A physical exam. During the exam, your health care provider will listen to your heartbeat and check your pulse. To confirm the diagnosis, your health care provider may order tests, such as:  Blood tests.  An electrocardiogram (ECG). This test records the heart's electrical activity. The test can show how fast your heart is beating and whether the heartbeat is steady.  A test in which you wear a portable device (event recorder or Holter monitor) to record your heart's electrical activity while you go about your day.  Anexercise test. How is this treated? Treatment for this condition depends on the cause of the condition and how severe your symptoms are. Treatment may involve:  Treatment of the underlying condition.  Changing your medicines or how much medicine you take.  Having a small, battery-operated device called a pacemaker implanted under the skin. When bradycardia occurs, this device can be used to increase your heart rate and help your heart beat in a regular rhythm. Follow these instructions at home: Lifestyle   Manage any health conditions that contribute to bradycardia as told by your health care provider.  Follow a heart-healthy diet. A nutrition specialist (dietitian) can help educate you about healthy food options and changes.  Follow an exercise program that is approved by your health care provider.  Maintain a healthy weight.  Try to reduce or manage your stress, such as with yoga or meditation. If you need help reducing stress, ask your health care provider.  Do not use any products that contain nicotine or tobacco, such as cigarettes, e-cigarettes, and chewing tobacco. If you need help   quitting, ask your health care provider.  Do not use illegal drugs.  Limit alcohol intake to no more than 1 drink a day for nonpregnant women and 2 drinks a day for men. Be aware of how much alcohol is in your drink. In the [place] one drink  equals one 12 oz bottle of beer (355 mL), one 5 oz glass of wine (148 mL), or one 1 oz glass of hard liquor (44 mL). General instructions  Take over-the-counter and prescription medicines only as told by your health care provider.  Keep all follow-up visits as told by your health care provider. This is important. How is this prevented? In some cases, bradycardia may be prevented by:  Treating underlying medical problems.  Stopping behaviors or medicines that can trigger the condition. Contact a health care provider if you:  Feel light-headed or dizzy.  Almost faint.  Feel weak or are easily fatigued during physical activity.  Experience confusion or have memory problems. Get help right away if:  You faint.  You have: ? An irregular heartbeat (palpitations). ? Chest pain. ? Trouble breathing. Summary  Bradycardia is a slower-than-normal heartbeat. With bradycardia, the resting heart rate is less than 60 beats per minute.  Treatment for this condition depends on the cause.  Manage any health conditions that contribute to bradycardia as told by your health care provider.  Do not use any products that contain nicotine or tobacco, such as cigarettes, e-cigarettes, and chewing tobacco, and limit alcohol intake.  Keep all follow-up visits as told by your health care provider. This is important. This information is not intended to replace advice given to you by your health care provider. Make sure you discuss any questions you have with your health care provider. Document Revised: 05/05/2018 Document Reviewed: 04/02/2018 [employer] Patient Education  2020 [employer]  

## 2020-05-31 NOTE — Discharge Summary (Signed)
Physician Discharge Summary  Nataleigh Griffin GNF:[id number] DOB: 1961-08-05 DOA: 05/29/2020  PCP: [place] Carbo, MD  Admit date: 05/29/2020 Discharge date: 05/31/2020  Admitted From: Home Disposition: Home  Recommendations for Outpatient Follow-up:  1. Follow up with PCP in 1-2 weeks 2. Follow-up with cardiology in 1-2 weeks following hospitalization for consideration of Holter monitor 3. Follow-up blood pressure log 4. Discontinued home lisinopril and HCTZ  Home Health: No Equipment/Devices: None  Discharge Condition: Stable CODE STATUS: Full code Diet recommendation: Heart healthy diet  History of present illness:  Loryn Haacke is a 59 year old female with past medical history notable for Covid-19 infection January 2021, hyperlipidemia, herpes zoster, essential hypertension who presented to the ER with complaints of lightheaded/dizziness. She also noted that her heart rate was low at 38 which she was monitoring with her smart watch.  She also reported palpitations with fluttering and generalized fatigue.  She also reports that her blood pressure as of late was running low.  In the ER, patient was noted to be bradycardic down to the upper 30s.  EKG with heart rate 45.  TSH was within normal limits.  EKG with nonischemic findings.  ED physician discussed with cardiology and requested hospitalist admission for further evaluation and observation.  Hospital course:  Symptomatic bradycardia Patient presenting from home with dizziness, fatigue and noted low heart rates in the 30s while she was monitoring on her smart watch.  She is also noted that her blood pressure has been low lately and she has been holding her home antihypertensives.  Patient is not on a beta-blocker outpatient.  Troponin III, within normal limits.  TSH 2.175, within normal limits.  CT head with no acute intracranial findings.  Chest x-ray unrevealing.  Cardiology was consulted and followed during  hospital course.  Echocardiogram with LVEF 55-60%, grade 1 diastolic dysfunction, no evidence of mitral stenosis, no aortic stenosis, IVC normal in size. Patient was monitored on telemetry with persistent bradycardia ranging from 37 to 55 during hospitalization.  No high degree AV block.  Given patient's symptoms have resolved with discontinuation of her antihypertensives, likely her symptoms related to low blood pressure rather than symptomatic bradycardia.  No further diagnostic testing per cardiology at this time and recommends outpatient follow-up with consideration of Holter monitor.  Hypokalemia Repleted during hospitalization.  Potassium 4.2 at time of discharge.  Essential hypertension On lisinopril 20 mg p.o. daily and HCTZ at home.  Her home antihypertensives were discontinued with close monitoring of her blood pressure there remained within reasonable range during hospitalization.  We will discontinue her home antihypertensives instructed to maintain a blood pressure log daily to bring to her next PCP/cardiology visit for further discussion.  Hyperlipidemia Continue home atorvastatin 40 mg p.o. daily  Elevated D-dimer D-dimer elevated at 760.  Bilateral duplex ultrasound lower extremities negative for DVT.  CTPA negative for pulmonary embolism.  Discharge Diagnoses:  Active Problems:   Essential hypertension   Hyperlipemia   Bradycardia    Discharge Instructions  Discharge Instructions    Ambulatory referral to Cardiology   Complete by: As directed    Call MD for:  difficulty breathing, headache or visual disturbances   Complete by: As directed    Call MD for:  extreme fatigue   Complete by: As directed    Call MD for:  persistant dizziness or light-headedness   Complete by: As directed    Call MD for:  persistant nausea and vomiting   Complete by: As directed  Call MD for:  severe uncontrolled pain   Complete by: As directed    Call MD for:  temperature >100.4    Complete by: As directed    Diet - low sodium heart healthy   Complete by: As directed    Increase activity slowly   Complete by: As directed      Allergies as of 05/31/2020   No Known Allergies     Medication List    STOP taking these medications   lisinopril 20 MG tablet Commonly known as: ZESTRIL     TAKE these medications   atorvastatin 40 MG tablet Commonly known as: LIPITOR Take 40 mg by mouth daily.   Cholecalciferol 50 MCG (2000 UT) Caps Take by mouth.       Follow-up Information    Bynum, Vincenza HewsWilliam Edwards IV, MD. Go on 06/16/2020.   Specialty: Family Medicine Why: Appointment at 2:20pm Contact information: [address] [place] [address] [phone]        Iran OuchArida, Muhammad A, MD. Go on 06/08/2020.   Specialty: Cardiology Why: Appointment at 8:30am Contact information: [address] [place] [address] [phone]              No Known Allergies  Consultations:  Cardiology, Dr. Kirke CorinArida.   Procedures/Studies: CT HEAD WO CONTRAST  Result Date: 05/29/2020 CLINICAL DATA:  59 year old female with history of dizziness and lightheadedness. Weakness. EXAM: CT HEAD WITHOUT CONTRAST TECHNIQUE: Contiguous axial images were obtained from the base of the skull through the vertex without intravenous contrast. COMPARISON:  No priors. FINDINGS: Brain: No evidence of acute infarction, hemorrhage, hydrocephalus, extra-axial collection or mass lesion/mass effect. Vascular: No hyperdense vessel or unexpected calcification. Skull: Normal. Negative for fracture or focal lesion. Sinuses/Orbits: No acute finding. Other: None. IMPRESSION: 1. No acute intracranial abnormalities. The appearance of the brain is normal for age. Electronically Signed   By: Trudie Reedaniel  Entrikin M.D.   On: 05/29/2020 13:22   CT ANGIO CHEST PE W OR WO CONTRAST  Result Date: 05/30/2020 CLINICAL DATA:  Positive D-dimer, dizziness.  Hypotension. EXAM: CT ANGIOGRAPHY CHEST WITH  CONTRAST TECHNIQUE: Multidetector CT imaging of the chest was performed using the standard protocol during bolus administration of intravenous contrast. Multiplanar CT image reconstructions and MIPs were obtained to evaluate the vascular anatomy. CONTRAST:  75mL OMNIPAQUE IOHEXOL 350 MG/ML SOLN COMPARISON:  None. FINDINGS: Cardiovascular: Negative for pulmonary embolus. Atherosclerotic calcification of the aorta and coronary arteries. Heart is enlarged. No pericardial effusion. Mediastinum/Nodes: Thyroid may be slightly enlarged and heterogeneous. No pathologically enlarged mediastinal, hilar or axillary lymph nodes. Esophagus is unremarkable. Lungs/Pleura: Calcified granuloma in the left upper lobe. No pleural fluid. Airway is unremarkable. Upper Abdomen: Visualized portions of the liver, gallbladder, adrenal glands, kidneys, spleen, pancreas, stomach and bowel are grossly unremarkable. Musculoskeletal: Degenerative changes in the spine. No worrisome lytic or sclerotic lesions. Review of the MIP images confirms the above findings. IMPRESSION: 1. Negative for pulmonary embolus. 2. Aortic atherosclerosis (ICD10-I70.0). Coronary artery calcification. Electronically Signed   By: Leanna BattlesMelinda  Blietz M.D.   On: 05/30/2020 10:23   US Venous Img Lower Bilateral (DVT)  Result Date: 05/30/2020 CLINICAL DATA:  59 year old female with positive D-dimer EXAM: BILATERAL LOWER EXTREMITY VENOUS DOPPLER ULTRASOUND TECHNIQUE: Gray-scale sonography with graded compression, as well as color Doppler and duplex ultrasound were performed to evaluate the lower extremity deep venous systems from the level of the common femoral vein and including the common femoral, femoral, profunda femoral, popliteal and calf veins including  the posterior tibial, peroneal and gastrocnemius veins when visible. The superficial great saphenous vein was also interrogated. Spectral Doppler was utilized to evaluate flow at rest and with distal augmentation  maneuvers in the common femoral, femoral and popliteal veins. COMPARISON:  None. FINDINGS: RIGHT LOWER EXTREMITY Common Femoral Vein: No evidence of thrombus. Normal compressibility, respiratory phasicity and response to augmentation. Saphenofemoral Junction: No evidence of thrombus. Normal compressibility and flow on color Doppler imaging. Profunda Femoral Vein: No evidence of thrombus. Normal compressibility and flow on color Doppler imaging. Femoral Vein: No evidence of thrombus. Normal compressibility, respiratory phasicity and response to augmentation. Popliteal Vein: No evidence of thrombus. Normal compressibility, respiratory phasicity and response to augmentation. Calf Veins: No evidence of thrombus. Normal compressibility and flow on color Doppler imaging. Superficial Great Saphenous Vein: No evidence of thrombus. Normal compressibility. Venous Reflux:  None. Other Findings:  None. LEFT LOWER EXTREMITY Common Femoral Vein: No evidence of thrombus. Normal compressibility, respiratory phasicity and response to augmentation. Saphenofemoral Junction: No evidence of thrombus. Normal compressibility and flow on color Doppler imaging. Profunda Femoral Vein: No evidence of thrombus. Normal compressibility and flow on color Doppler imaging. Femoral Vein: No evidence of thrombus. Normal compressibility, respiratory phasicity and response to augmentation. Popliteal Vein: No evidence of thrombus. Normal compressibility, respiratory phasicity and response to augmentation. Calf Veins: No evidence of thrombus. Normal compressibility and flow on color Doppler imaging. Superficial Great Saphenous Vein: No evidence of thrombus. Normal compressibility. Venous Reflux:  None. Other Findings:  None. IMPRESSION: No evidence of deep venous thrombosis in either lower extremity. Electronically Signed   By: Malachy Moan M.D.   On: 05/30/2020 10:58   DG Chest Portable 1 View  Result Date: 05/29/2020 CLINICAL DATA:   59 year old female with weakness. EXAM: PORTABLE CHEST 1 VIEW COMPARISON:  None. FINDINGS: No focal consolidation, pleural effusion, pneumothorax. Top-normal cardiac size. No acute osseous pathology. IMPRESSION: No active disease. Electronically Signed   By: Elgie Collard M.D.   On: 05/29/2020 17:05   ECHOCARDIOGRAM COMPLETE  Result Date: 05/31/2020    ECHOCARDIOGRAM REPORT   Patient Name:   ANALIAH DRUM Date of Exam: 05/30/2020 Medical Rec #:  [id number]          Height:       65.0 in Accession #:    [id number]         Weight:       170.0 lb Date of Birth:  [birth year]           BSA:          1.846 m Patient Age:    59 years           BP:           128/75 mmHg Patient Gender: F                  HR:           49 bpm. Exam Location:  [facility] Procedure: 2D Echo, Cardiac Doppler and Color Doppler Indications:     Palpitations 785.1 / R00.2  History:         Patient has no prior history of Echocardiogram examinations.                  Risk Factors:Hypertension.  Sonographer:     Neysa Bonito Roar Referring Phys:  Kenn File DOUTOVA Diagnosing Phys: Marcina Millard MD IMPRESSIONS  1. Left ventricular ejection fraction, by estimation, is 55 to 60%. The left ventricle has normal function. The  left ventricle has no regional wall motion abnormalities. Left ventricular diastolic parameters are consistent with Grade I diastolic dysfunction (impaired relaxation).  2. Right ventricular systolic function is normal. The right ventricular size is normal. There is normal pulmonary artery systolic pressure.  3. The mitral valve is normal in structure. Trivial mitral valve regurgitation. No evidence of mitral stenosis.  4. The aortic valve is normal in structure. Aortic valve regurgitation is not visualized. No aortic stenosis is present.  5. The inferior vena cava is normal in size with greater than 50% respiratory variability, suggesting right atrial pressure of 3 mmHg. FINDINGS  Left Ventricle: Left ventricular ejection  fraction, by estimation, is 55 to 60%. The left ventricle has normal function. The left ventricle has no regional wall motion abnormalities. The left ventricular internal cavity size was normal in size. There is  no left ventricular hypertrophy. Left ventricular diastolic parameters are consistent with Grade I diastolic dysfunction (impaired relaxation). Right Ventricle: The right ventricular size is normal. No increase in right ventricular wall thickness. Right ventricular systolic function is normal. There is normal pulmonary artery systolic pressure. The tricuspid regurgitant velocity is 2.48 m/s, and  with an assumed right atrial pressure of 10 mmHg, the estimated right ventricular systolic pressure is 34.6 mmHg. Left Atrium: Left atrial size was normal in size. Right Atrium: Right atrial size was normal in size. Pericardium: There is no evidence of pericardial effusion. Mitral Valve: The mitral valve is normal in structure. Normal mobility of the mitral valve leaflets. Trivial mitral valve regurgitation. No evidence of mitral valve stenosis. Tricuspid Valve: The tricuspid valve is normal in structure. Tricuspid valve regurgitation is trivial. No evidence of tricuspid stenosis. Aortic Valve: The aortic valve is normal in structure. Aortic valve regurgitation is not visualized. No aortic stenosis is present. Aortic valve peak gradient measures 10.1 mmHg. Pulmonic Valve: The pulmonic valve was normal in structure. Pulmonic valve regurgitation is not visualized. No evidence of pulmonic stenosis. Aorta: The aortic root is normal in size and structure. Venous: The inferior vena cava is normal in size with greater than 50% respiratory variability, suggesting right atrial pressure of 3 mmHg. IAS/Shunts: No atrial level shunt detected by color flow Doppler.  LEFT VENTRICLE PLAX 2D LVIDd:         4.89 cm  Diastology LVIDs:         3.38 cm  LV e' lateral:   9.36 cm/s LV PW:         1.14 cm  LV E/e' lateral: 9.3 LV IVS:         1.15 cm  LV e' medial:    6.85 cm/s LVOT diam:     2.10 cm  LV E/e' medial:  12.7 LVOT Area:     3.46 cm  RIGHT VENTRICLE RV Mid diam:    2.81 cm RV S prime:     13.50 cm/s TAPSE (M-mode): 2.7 cm LEFT ATRIUM             Index       RIGHT ATRIUM           Index LA diam:        4.00 cm 2.17 cm/m  RA Area:     14.90 cm LA Vol (A2C):   76.3 ml 41.33 ml/m RA Volume:   34.90 ml  18.90 ml/m LA Vol (A4C):   60.4 ml 32.72 ml/m LA Biplane Vol: 72.4 ml 39.22 ml/m  AORTIC VALVE  PULMONIC VALVE AV Area (Vmax): 2.66 cm    PV Vmax:        0.95 m/s AV Vmax:        159.00 cm/s PV Peak grad:   3.6 mmHg AV Peak Grad:   10.1 mmHg   RVOT Peak grad: 2 mmHg LVOT Vmax:      122.00 cm/s  AORTA Ao Root diam: 3.00 cm MITRAL VALVE                TRICUSPID VALVE MV Area (PHT): 2.66 cm     TR Peak grad:   24.6 mmHg MV Decel Time: 285 msec     TR Vmax:        248.00 cm/s MV E velocity: 87.00 cm/s MV A velocity: 106.00 cm/s  SHUNTS MV E/A ratio:  0.82         Systemic Diam: 2.10 cm MV A Prime:    12.0 cm/s Marcina Millard MD Electronically signed by Marcina Millard MD Signature Date/Time: 05/31/2020/12:13:26 PM    Final      Subjective: Patient seen and examined at bedside, resting comfortably.  Has been asymptomatic since admission.  Her home antihypertensives were held.  Cardiology seen today, no further evaluation okay for discharge with outpatient follow-up.  Denies chest pain, no shortness of breath, no palpitations, no abdominal pain.  No acute events overnight per nursing staff.  Discharge Exam: Vitals:   05/31/20 1329 05/31/20 1333  BP: (!) 138/86 (!) 129/81  Pulse:  70  Resp:    Temp:    SpO2: 100% 100%   Vitals:   05/31/20 1216 05/31/20 1327 05/31/20 1329 05/31/20 1333  BP: (!) 131/100 (!) 131/80 (!) 138/86 (!) 129/81  Pulse: 53 67  70  Resp: 18     Temp: 98.4 F (36.9 C)     TempSrc: Oral     SpO2: 100% 100% 100% 100%  Weight:      Height:        General: Pt is alert, awake,  not in acute distress Cardiovascular: Bradycardic, regular rhythm, S1/S2 +, no rubs, no gallops Respiratory: CTA bilaterally, no wheezing, no rhonchi Abdominal: Soft, NT, ND, bowel sounds + Extremities: no edema, no cyanosis    The results of significant diagnostics from this hospitalization (including imaging, microbiology, ancillary and laboratory) are listed below for reference.     Microbiology: Recent Results (from the past 240 hour(s))  SARS Coronavirus 2 by RT PCR (hospital order, performed in [facility] hospital lab) Nasopharyngeal Nasopharyngeal Swab     Status: None   Collection Time: 05/29/20  5:08 PM   Specimen: Nasopharyngeal Swab  Result Value Ref Range Status   SARS Coronavirus 2 NEGATIVE NEGATIVE Final    Comment: (NOTE) SARS-CoV-2 target nucleic acids are NOT DETECTED.  The SARS-CoV-2 RNA is generally detectable in upper and lower respiratory specimens during the acute phase of infection. The lowest concentration of SARS-CoV-2 viral copies this assay can detect is 250 copies / mL. A negative result does not preclude SARS-CoV-2 infection and should not be used as the sole basis for treatment or other patient management decisions.  A negative result may occur with improper specimen collection / handling, submission of specimen other than nasopharyngeal swab, presence of viral mutation(s) within the areas targeted by this assay, and inadequate number of viral copies (<250 copies / mL). A negative result must be combined with clinical observations, patient history, and epidemiological information.  Fact Sheet for Patients:   BoilerBrush.com.cy  Fact  Sheet for Healthcare Providers: https://pope.com/  This test is not yet approved or  cleared by the [place] and has been authorized for detection and/or diagnosis of SARS-CoV-2 by FDA under an Emergency Use Authorization (EUA).  This EUA will remain in effect  (meaning this test can be used) for the duration of the COVID-19 declaration under Section 564(b)(1) of the Act, 21 U.S.C. section 360bbb-3(b)(1), unless the authorization is terminated or revoked sooner.  Performed at [facility], [address] [place], [place] [address]      Labs: BNP (last 3 results) Recent Labs    05/29/20 1358  BNP 97.8   Basic Metabolic Panel: Recent Labs  Lab 05/29/20 1358 05/29/20 1928 05/30/20 0656 05/31/20 0530  NA 142  --  143 140  K 4.4  --  3.4* 4.2  CL 108  --  113* 108  CO2 27  --  25 26  GLUCOSE 99  --  118* 94  BUN 10  --  14 12  CREATININE 0.88  --  0.81 0.79  CALCIUM 9.5  --  8.3* 9.1  MG  --  2.3 2.2 2.3  PHOS  --  3.3 4.3  --    Liver Function Tests: Recent Labs  Lab 05/29/20 1358 05/30/20 0656  AST 31 24  ALT 47* 36  ALKPHOS 56 54  BILITOT 0.7 0.5  PROT 7.6 6.1*  ALBUMIN 4.4 3.5   No results for input(s): LIPASE, AMYLASE in the last 168 hours. No results for input(s): AMMONIA in the last 168 hours. CBC: Recent Labs  Lab 05/29/20 1358 05/30/20 0656  WBC 4.6 6.1  NEUTROABS  --  3.5  HGB 13.1 11.8*  HCT 39.7 35.2*  MCV 91.1 91.7  PLT 310 278   Cardiac Enzymes: Recent Labs  Lab 05/29/20 1928  CKTOTAL 90   BNP: Invalid input(s): POCBNP CBG: Recent Labs  Lab 05/31/20 1216  GLUCAP 94   D-Dimer No results for input(s): DDIMER in the last 72 hours. Hgb A1c No results for input(s): HGBA1C in the last 72 hours. Lipid Profile No results for input(s): CHOL, HDL, LDLCALC, TRIG, CHOLHDL, LDLDIRECT in the last 72 hours. Thyroid function studies Recent Labs    05/30/20 0656  TSH 2.197   Anemia work up No results for input(s): VITAMINB12, FOLATE, FERRITIN, TIBC, IRON, RETICCTPCT in the last 72 hours. Urinalysis    Component Value Date/Time   COLORURINE STRAW (A) 05/29/2020 1300   APPEARANCEUR CLEAR (A) 05/29/2020 1300   LABSPEC 1.006 05/29/2020 1300   PHURINE 6.0 05/29/2020 1300    GLUCOSEU NEGATIVE 05/29/2020 1300   HGBUR SMALL (A) 05/29/2020 1300   BILIRUBINUR NEGATIVE 05/29/2020 1300   KETONESUR NEGATIVE 05/29/2020 1300   PROTEINUR NEGATIVE 05/29/2020 1300   NITRITE NEGATIVE 05/29/2020 1300   LEUKOCYTESUR NEGATIVE 05/29/2020 1300   Sepsis Labs Invalid input(s): PROCALCITONIN,  WBC,  LACTICIDVEN Microbiology Recent Results (from the past 240 hour(s))  SARS Coronavirus 2 by RT PCR (hospital order, performed in [facility] hospital lab) Nasopharyngeal Nasopharyngeal Swab     Status: None   Collection Time: 05/29/20  5:08 PM   Specimen: Nasopharyngeal Swab  Result Value Ref Range Status   SARS Coronavirus 2 NEGATIVE NEGATIVE Final    Comment: (NOTE) SARS-CoV-2 target nucleic acids are NOT DETECTED.  The SARS-CoV-2 RNA is generally detectable in upper and lower respiratory specimens during the acute phase of infection. The lowest concentration of SARS-CoV-2 viral copies this assay can detect is 250 copies /  mL. A negative result does not preclude SARS-CoV-2 infection and should not be used as the sole basis for treatment or other patient management decisions.  A negative result may occur with improper specimen collection / handling, submission of specimen other than nasopharyngeal swab, presence of viral mutation(s) within the areas targeted by this assay, and inadequate number of viral copies (<250 copies / mL). A negative result must be combined with clinical observations, patient history, and epidemiological information.  Fact Sheet for Patients:   BoilerBrush.com.cy  Fact Sheet for Healthcare Providers: https://pope.com/  This test is not yet approved or  cleared by the [place] FDA and has been authorized for detection and/or diagnosis of SARS-CoV-2 by FDA under an Emergency Use Authorization (EUA).  This EUA will remain in effect (meaning this test can be used) for the duration of the COVID-19  declaration under Section 564(b)(1) of the Act, 21 U.S.C. section 360bbb-3(b)(1), unless the authorization is terminated or revoked sooner.  Performed at [facility], [address] [place], [place] [address]      Time coordinating discharge: Over 30 minutes  SIGNED:   Alvira Philips [place], DO  [facility] 05/31/2020, 2:44 PM

## 2020-06-01 ENCOUNTER — Telehealth: Payer: Self-pay | Admitting: Cardiovascular Disease

## 2020-06-01 DIAGNOSIS — R002 Palpitations: Secondary | ICD-10-CM

## 2020-06-01 NOTE — Telephone Encounter (Signed)
Patient calling to discuss if monitor was mailed. She was told at hospital this was going to be mailed.   Please call.

## 2020-06-01 NOTE — Telephone Encounter (Signed)
Dr. Mariah Milling did not really feel this was needed unless she wanted it. He left it up to her. If she would like the monitor, it can be mailed under his name.

## 2020-06-01 NOTE — Telephone Encounter (Signed)
No answer. Left message to call back.   

## 2020-06-01 NOTE — Telephone Encounter (Signed)
F/u appointment is 06/08/20 with Gillian Shields, NP. Monitor could be applied at that time if appropriate. According to Eula Listen, PA-C hospital note patient deferred monitor at that time. Routing to [employer] for advice.

## 2020-06-01 NOTE — Telephone Encounter (Signed)
Patient would really like to wear the monitor. She is concerned about what is going on with her heart.  I suggested we could put it on at her office visit on 06/08/20 as it would take a week to receive it in the mail anyway. She did not want to wait that long and asked if she could come in to have it placed. Patient asked if she could pick it up from [place] and place it at home. We do have a nurse on triage tomorrow that could place the monitor if available at that time. Otherwise, patient can be given the monitor to take home and apply on her own.  Order entered and placed on appointment desk.

## 2020-06-02 ENCOUNTER — Other Ambulatory Visit: Payer: Self-pay

## 2020-06-02 ENCOUNTER — Ambulatory Visit (INDEPENDENT_AMBULATORY_CARE_PROVIDER_SITE_OTHER): Payer: Self-pay

## 2020-06-02 DIAGNOSIS — R002 Palpitations: Secondary | ICD-10-CM

## 2020-06-08 ENCOUNTER — Ambulatory Visit: Payer: Self-pay | Admitting: Family

## 2020-06-24 ENCOUNTER — Telehealth: Payer: Self-pay | Admitting: Family

## 2020-06-24 ENCOUNTER — Telehealth: Payer: Self-pay | Admitting: Cardiovascular Disease

## 2020-06-24 NOTE — Telephone Encounter (Signed)
Duplicate encounter. See other telephone encounter. Same date.

## 2020-06-24 NOTE — Telephone Encounter (Signed)
Returned the call to [employer]. Spoke with [place]. IRhythm reporting an episode of symptomatic bradycardia on 06/12/20 34 bpm lasting 30 seconds in duration. The final report is available on the [employer].  [employer] final report reviewed with Eula Listen, PA. Per Alycia Rossetti no action is required. The patient is to follow up as planned with Gillian Shields, NP on 06/30/20.

## 2020-06-24 NOTE — Telephone Encounter (Signed)
  1. Is this related to a heart monitor you are wearing?  (If the patient says no, please ask     if they are caling about ICD/pacemaker.) no finalized report   2. What is your issue??  [employer] calling to report abnormal report .  Please call rep declined to provide additional info .    Please route to covering RN/CMA/RMA for results. Route to monitor technicians or your monitor tech representative for your site for any technical concerns

## 2020-06-24 NOTE — Telephone Encounter (Signed)
Please call regarding abnormal results.

## 2020-06-29 NOTE — Patient Instructions (Addendum)
Medication Instructions:  No medication changes today.   *If you need a refill on your cardiac medications before your next appointment, please call your pharmacy*  Lab Work: No lab work today.  Testing/Procedures: Your EKG today shows sinus bradycardia which is a slow but regular heart beat.  Follow-Up: At [facility], you and your health needs are our priority.  As part of our continuing mission to provide you with exceptional heart care, we have created designated Provider Care Teams.  These Care Teams include your primary Cardiologist (physician) and Advanced Practice Providers (APPs -  Physician Assistants and Nurse Practitioners) who all work together to provide you with the care you need, when you need it.  We recommend signing up for the patient portal called "MyChart".  Sign up information is provided on this After Visit Summary.  MyChart is used to connect with patients for Virtual Visits (Telemedicine).  Patients are able to view lab/test results, encounter notes, upcoming appointments, etc.  Non-urgent messages can be sent to your provider as well.   To learn more about what you can do with MyChart, go to [email or web address].    Your next appointment:   You have been referred to electrophysiology.   Other Instructions   Check your blood pressure once per day at home and keep a log.  Tips to Measure your Blood Pressure Correctly  To determine whether you have hypertension, a medical professional will take a blood pressure reading. How you prepare for the test, the position of your arm, and other factors can change a blood pressure reading by 10% or more. That could be enough to hide high blood pressure, start you on a drug you don't really need, or lead your doctor to incorrectly adjust your medications.  In 2017, new guidelines from the American Heart Association, the [employer] of Cardiology, and nine other health organizations lowered the diagnosis of high  blood pressure to 130/80 mm Hg or higher for all adults. The guidelines also redefined the various blood pressure categories to now include normal, elevated, Stage 1 hypertension, Stage 2 hypertension, and hypertensive crisis (see "Blood pressure categories").  Blood pressure categories  Blood pressure category SYSTOLIC (upper number)  DIASTOLIC (lower number)  Normal Less than 120 mm Hg and Less than 80 mm Hg  Elevated 120-129 mm Hg and Less than 80 mm Hg  High blood pressure: Stage 1 hypertension 130-139 mm Hg or 80-89 mm Hg  High blood pressure: Stage 2 hypertension 140 mm Hg or higher or 90 mm Hg or higher  Hypertensive crisis (consult your doctor immediately) Higher than 180 mm Hg and/or Higher than 120 mm Hg  Source: American Heart Association and American Stroke Association. For more on getting your blood pressure under control, buy Controlling Your Blood Pressure, a Special Health Report from [facility].   Blood Pressure Log   Date   Time  Blood Pressure  Position  Example: Nov 1 9 AM 124/78 sitting                                                    Heart-Healthy Eating Plan Heart-healthy meal planning includes:  Eating less unhealthy fats.  Eating more healthy fats.  Making other changes in your diet. Talk with your doctor or a diet specialist (dietitian) to create an eating plan that is  right for you. What are tips for following this plan? Cooking Avoid frying your food. Try to bake, boil, grill, or broil it instead. You can also reduce fat by:  Removing the skin from poultry.  Removing all visible fats from meats.  Steaming vegetables in water or broth. Meal planning   At meals, divide your plate into four equal parts: ? Fill one-half of your plate with vegetables and green salads. ? Fill one-fourth of your plate with whole grains. ? Fill one-fourth of your plate with lean protein foods.  Eat 4-5 servings of vegetables per  day. A serving of vegetables is: ? 1 cup of raw or cooked vegetables. ? 2 cups of raw leafy greens.  Eat 4-5 servings of fruit per day. A serving of fruit is: ? 1 medium whole fruit. ?  cup of dried fruit. ?  cup of fresh, frozen, or canned fruit. ?  cup of 100% fruit juice.  Eat more foods that have soluble fiber. These are apples, broccoli, carrots, beans, peas, and barley. Try to get 20-30 g of fiber per day.  Eat 4-5 servings of nuts, legumes, and seeds per week: ? 1 serving of dried beans or legumes equals  cup after being cooked. ? 1 serving of nuts is  cup. ? 1 serving of seeds equals 1 tablespoon. General information  Eat more home-cooked food. Eat less restaurant, buffet, and fast food.  Limit or avoid alcohol.  Limit foods that are high in starch and sugar.  Avoid fried foods.  Lose weight if you are overweight.  Keep track of how much salt (sodium) you eat. This is important if you have high blood pressure. Ask your doctor to tell you more about this.  Try to add vegetarian meals each week. Fats  Choose healthy fats. These include olive oil and canola oil, flaxseeds, walnuts, almonds, and seeds.  Eat more omega-3 fats. These include salmon, mackerel, sardines, tuna, flaxseed oil, and ground flaxseeds. Try to eat fish at least 2 times each week.  Check food labels. Avoid foods with trans fats or high amounts of saturated fat.  Limit saturated fats. ? These are often found in animal products, such as meats, butter, and cream. ? These are also found in plant foods, such as palm oil, palm kernel oil, and coconut oil.  Avoid foods with partially hydrogenated oils in them. These have trans fats. Examples are stick margarine, some tub margarines, cookies, crackers, and other baked goods. What foods can I eat? Fruits All fresh, canned (in natural juice), or frozen fruits. Vegetables Fresh or frozen vegetables (raw, steamed, roasted, or grilled). Green  salads. Grains Most grains. Choose whole wheat and whole grains most of the time. Rice and pasta, including brown rice and pastas made with whole wheat. Meats and other proteins Lean, well-trimmed beef, veal, pork, and lamb. Chicken and [place] without skin. All fish and shellfish. Wild duck, rabbit, pheasant, and venison. Egg whites or low-cholesterol egg substitutes. Dried beans, peas, lentils, and tofu. Seeds and most nuts. Dairy Low-fat or nonfat cheeses, including ricotta and mozzarella. Skim or 1% milk that is liquid, powdered, or evaporated. Buttermilk that is made with low-fat milk. Nonfat or low-fat yogurt. Fats and oils Non-hydrogenated (trans-free) margarines. Vegetable oils, including soybean, sesame, sunflower, olive, peanut, safflower, corn, canola, and cottonseed. Salad dressings or mayonnaise made with a vegetable oil. Beverages Mineral water. Coffee and tea. Diet carbonated beverages. Sweets and desserts Sherbet, gelatin, and fruit ice. Small amounts of dark chocolate.  Limit all sweets and desserts. Seasonings and condiments All seasonings and condiments. The items listed above may not be a complete list of foods and drinks you can eat. Contact a dietitian for more options. What foods should I avoid? Fruits Canned fruit in heavy syrup. Fruit in cream or butter sauce. Fried fruit. Limit coconut. Vegetables Vegetables cooked in cheese, cream, or butter sauce. Fried vegetables. Grains Breads that are made with saturated or trans fats, oils, or whole milk. Croissants. Sweet rolls. Donuts. High-fat crackers, such as cheese crackers. Meats and other proteins Fatty meats, such as hot dogs, ribs, sausage, bacon, rib-eye roast or steak. High-fat deli meats, such as salami and bologna. Caviar. Domestic duck and goose. Organ meats, such as liver. Dairy Cream, sour cream, cream cheese, and creamed cottage cheese. Whole-milk cheeses. Whole or 2% milk that is liquid, evaporated, or  condensed. Whole buttermilk. Cream sauce or high-fat cheese sauce. Yogurt that is made from whole milk. Fats and oils Meat fat, or shortening. Cocoa butter, hydrogenated oils, palm oil, coconut oil, palm kernel oil. Solid fats and shortenings, including bacon fat, salt pork, lard, and butter. Nondairy cream substitutes. Salad dressings with cheese or sour cream. Beverages Regular sodas and juice drinks with added sugar. Sweets and desserts Frosting. Pudding. Cookies. Cakes. Pies. Milk chocolate or white chocolate. Buttered syrups. Full-fat ice cream or ice cream drinks. The items listed above may not be a complete list of foods and drinks to avoid. Contact a dietitian for more information. Summary  Heart-healthy meal planning includes eating less unhealthy fats, eating more healthy fats, and making other changes in your diet.  Eat a balanced diet. This includes fruits and vegetables, low-fat or nonfat dairy, lean protein, nuts and legumes, whole grains, and heart-healthy oils and fats. This information is not intended to replace advice given to you by your health care provider. Make sure you discuss any questions you have with your health care provider. Document Revised: 12/26/2017 Document Reviewed: 11/29/2017 Elsevier Patient Education  2020 [employer].

## 2020-06-30 ENCOUNTER — Encounter: Payer: Self-pay | Admitting: Family

## 2020-06-30 ENCOUNTER — Ambulatory Visit (INDEPENDENT_AMBULATORY_CARE_PROVIDER_SITE_OTHER): Payer: Self-pay | Admitting: Family

## 2020-06-30 ENCOUNTER — Other Ambulatory Visit: Payer: Self-pay

## 2020-06-30 VITALS — BP 128/82 | HR 48 | Ht 65.0 in | Wt 172.0 lb

## 2020-06-30 DIAGNOSIS — I10 Essential (primary) hypertension: Secondary | ICD-10-CM

## 2020-06-30 DIAGNOSIS — R002 Palpitations: Secondary | ICD-10-CM

## 2020-06-30 DIAGNOSIS — R001 Bradycardia, unspecified: Secondary | ICD-10-CM

## 2020-06-30 NOTE — Progress Notes (Signed)
Office Visit    Patient Name: Becky Barnes Date of Encounter: 06/30/2020  Primary Care Provider:  West Carbo, MD Primary Cardiologist:  New to [facility] - hospital consult by Dr. Kirke Corin Electrophysiologist:  To establish with Dr. Lalla Brothers  Chief Complaint    Becky Barnes is a 59 y.o. female with a hx of COVID-19 in 11/2019, HTN, bradycardia presents today for follow-up after ZIO monitor  Past Medical History    Past Medical History:  Diagnosis Date  . Bradycardia   . COVID-19 11/2019  . Hypertension    Past Surgical History:  Procedure Laterality Date  . ABDOMINAL HYSTERECTOMY      Allergies  No Known Allergies  History of Present Illness    Becky Barnes is a 59 y.o. female with a hx of COVID-19 in 11/2019, HTN, bradycardia last seen 05/31/2020 while hospitalized.  She was diagnosed with COVID-19 in January 2021 which did not require hospitalization.  She has subsequently been fully vaccinated.  Cardiology was consulted during recent admission due to bradycardia.  She reported her baseline heart rate in the 50s and was not on any AV nodal blocking agents.    Work-up included normal TSH, high-sensitivity troponin negative x2, D-dimer mildly elevated, CT head nonacute, chest x-ray nonacute, normal potassium.  She endorses fatigue. Tells me when she presses the button for her monitor she noted some "fluttering" feeling. Not associated with pain. Does make her feel she needs to get a deep breath. Happens at rest and with activity. It comes and goes on its own. Tells me sometimes she will have a "series" of these events that lasts a few minutes. Tells me she has had some lightheaded spells, but not syncope.   She speed walks and runs for exercise in the evenings. Her speed walk session is generally about 20 minutes - she alternates walking at normal speed and speed walking every other minute. She has done this for a long time. She also will do 45 minutes  on the treadmill. She has been doing this for 10 months.   She has seen HR in the 40s when resting on her apple watch. She is staying around 44 bpm or 48 bpm. No recurrent heart rates in the 30s. No family history of PPM.  She was started on antihypertensive after having Covid and these have gradually been reduced due to dizziness.  She reports checking her blood pressure at home intermittently.  Systolic is typically in the 120s.  She is concerned of her diastolic BP occasionally is greater than 80. Her Lisinopril and HCTZ were both discontinued while hospitalized.  EKGs/Labs/Other Studies Reviewed:   The following studies were reviewed today:   Zio 06/24/20 2-week ZIO monitor:   Normal sinus rhythm with an average heart rate of 57 bpm Rare PACs and rare PVCs. Intermittent sinus bradycardia during daytime with lowest heart rate of 39 bpm.  Most of the bradycardic events were triggered. Many triggered events did not correlate with arrhythmia.  Echo 05/30/20  1. Left ventricular ejection fraction, by estimation, is 55 to 60%. The  left ventricle has normal function. The left ventricle has no regional  wall motion abnormalities. Left ventricular diastolic parameters are  consistent with Grade I diastolic  dysfunction (impaired relaxation).   2. Right ventricular systolic function is normal. The right ventricular  size is normal. There is normal pulmonary artery systolic pressure.   3. The mitral valve is normal in structure. Trivial mitral valve  regurgitation. No  evidence of mitral stenosis.   4. The aortic valve is normal in structure. Aortic valve regurgitation is  not visualized. No aortic stenosis is present.   5. The inferior vena cava is normal in size with greater than 50%  respiratory variability, suggesting right atrial pressure of 3 mmHg.    EKG:  EKG is ordered today.  The ekg ordered today demonstrates SB 48 bpm with no acute St/T wave changes.  Recent Labs: 05/29/2020: B  Natriuretic Peptide 97.8 05/30/2020: ALT 36; Hemoglobin 11.8; Platelets 278; TSH 2.197 05/31/2020: BUN 12; Creatinine, Ser 0.79; Magnesium 2.3; Potassium 4.2; Sodium 140  Recent Lipid Panel No results found for: CHOL, TRIG, HDL, CHOLHDL, VLDL, LDLCALC, LDLDIRECT  Home Medications   Current Meds  Medication Sig  . atorvastatin (LIPITOR) 40 MG tablet Take 40 mg by mouth daily.  . Cholecalciferol 50 MCG (2000 UT) CAPS Take by mouth.    Review of Systems    Review of Systems  Constitutional: Positive for malaise/fatigue. Negative for chills and fever.  Cardiovascular: Positive for palpitations. Negative for chest pain, dyspnea on exertion, irregular heartbeat, leg swelling, near-syncope, orthopnea and syncope.  Respiratory: Negative for cough, shortness of breath and wheezing.   Gastrointestinal: Negative for melena, nausea and vomiting.  Genitourinary: Negative for hematuria.  Neurological: Positive for light-headedness. Negative for dizziness and weakness.   All other systems reviewed and are otherwise negative except as noted above.  Physical Exam    VS:  BP 128/82 (BP Location: Left Arm, Patient Position: Sitting, Cuff Size: Normal)   Pulse (!) 48   Ht 5\' 5"  (1.651 m)   Wt 172 lb (78 kg)   BMI 28.62 kg/m  , BMI Body mass index is 28.62 kg/m. GEN: Well nourished, well developed, in no acute distress. HEENT: normal. Neck: Supple, no JVD, carotid bruits, or masses. Cardiac: bradycardic and regular, no murmurs, rubs, or gallops. No clubbing, cyanosis, edema.  Radials/DP/PT 2+ and equal bilaterally.  Respiratory:  Respirations regular and unlabored, clear to auscultation bilaterally. GI: Soft, nontender, nondistended, BS + x 4. MS: No deformity or atrophy. Skin: Warm and dry, no rash. Neuro:  Strength and sensation are intact. Psych: Normal affect.  Assessment & Plan    1. Sinus bradycardia/Palpitations -longstanding history of baseline bradycardia in the 50s.  However heart  rate decreased to the 40s and she was seen in the hospital.  ZIO monitor in which she triggered multiple episodes of hypotension.  Reports symptoms are like a "flutter ".  Reports lightheadedness but no syncope.  We will have her establish with EP.  If heart rate does not continue to improve may need to consider PPM.  However in setting of ongoing palpitations question whether repeat monitor versus loop recorder would be of benefit.  She has no family history of pacemaker.  She is on no AV nodal blocking agents.  Echo 05/30/2020 with normal LVEF, grade 1 diastolic dysfunction, no significant valvular abnormalities.  2. History of COVID-19 11/2019 -unclear if there is causal relationship between her COVID-19 infection and heart rate, no evidence of arrhythmia or high grade AV block.  3. Hypokalemia -repleted during recent admission.  4. HTN -blood pressure well controlled presently on no antihypertensive medications.  She was started on antihypertensives after Covid infection.  Would not resume hydrochlorothiazide in the setting of hypokalemia, dizziness, bradycardia.  Will check in via phone call in 1 week for report of blood pressure.  BP goal less than 130/80.  If BP consistently elevated consider  low-dose lisinopril. Hesitant to resume at this time due to concern for lightheadedness and need to differentiate symptoms of bradycardia vs hypotension.  Disposition: Fo sinus bradycardia llow up phone call in 1 week to check in on blood pressure with Gillian Shields, NP. Establish with EP.    Alver Sorrow, NP 06/30/2020, 4:23 PM

## 2020-07-05 NOTE — Progress Notes (Signed)
Electrophysiology Office Note:    Date:  07/06/2020   ID:  Becky Barnes, DOB [birth year], MRN [id number]  PCP:  West Carbo, MD  [facility] Cardiologist:  No primary care provider on file.  [facility] Electrophysiologist:  Lanier Prude, MD   Referring MD: Alver Sorrow, NP   Chief Complaint: Sinus bradycardia  History of Present Illness:    Becky Barnes is a 59 y.o. female with a hx of HTN, previous COVID infection who presents to clinic to discuss sinus bradycardia and palpitations. She reports symptomatic episodes of low heart rates in the 40s. The most recent episode was during a swim. She was swimming laps for 30 minutes and on the last lap she held her breath. When she got to the other end fo the pool she felt terrible and nauseous. The symptoms lasted for almost 30 minutes. During the episode she was bradycardic. No syncope that day. She went home but continued to feel nauseous and lightheaded. These symptoms lasted for several days and resulted in her seeking care at the hospital. She is not taking any AV nodal agents. She tells me there was one other similar event while she was seated at the table studying. During the episode she suddenly felt lightheaded and dizzy and almost passed out. During that episode she checked her pulse and it was low.   She tells me that HR used to be in the 50s but now are in the 40s without any change in medications, etc.  Past Medical History:  Diagnosis Date  . Bradycardia   . COVID-19 11/2019  . Hypertension     Past Surgical History:  Procedure Laterality Date  . ABDOMINAL HYSTERECTOMY      Current Medications: Current Meds  Medication Sig  . atorvastatin (LIPITOR) 40 MG tablet Take 40 mg by mouth daily.     Allergies:   Patient has no known allergies.   Social History   Socioeconomic History  . Marital status: Single    Spouse name: Not on file  . Number of children: Not on file  . Years of  education: Not on file  . Highest education level: Not on file  Occupational History  . Not on file  Tobacco Use  . Smoking status: Former Smoker    Quit date: 01/22/1990    Years since quitting: 30.4  . Smokeless tobacco: Never Used  Vaping Use  . Vaping Use: Never used  Substance and Sexual Activity  . Alcohol use: Never  . Drug use: Never  . Sexual activity: Not on file  Other Topics Concern  . Not on file  Social History Narrative  . Not on file   Social Determinants of Health   Financial Resource Strain:   . Difficulty of Paying Living Expenses: Not on file  Food Insecurity:   . Worried About Programme researcher, broadcasting/film/video in the Last Year: Not on file  . Ran Out of Food in the Last Year: Not on file  Transportation Needs:   . Lack of Transportation (Medical): Not on file  . Lack of Transportation (Non-Medical): Not on file  Physical Activity:   . Days of Exercise per Week: Not on file  . Minutes of Exercise per Session: Not on file  Stress:   . Feeling of Stress : Not on file  Social Connections:   . Frequency of Communication with Friends and Family: Not on file  . Frequency of Social Gatherings with Friends and Family: Not  on file  . Attends Religious Services: Not on file  . Active Member of Clubs or Organizations: Not on file  . Attends Banker Meetings: Not on file  . Marital Status: Not on file     Family History: The patient's family history includes Congestive Heart Failure in her mother; Diabetes in her mother; Hypertension in her mother; Pancreatic cancer in her mother; Prostate cancer in her father.  ROS:   Please see the history of present illness.    All other systems reviewed and are negative.  EKGs/Labs/Other Studies Reviewed:    The following studies were reviewed today: ECG, zio  06/24/2020 Zio Sinus rhythm. Average HR 57. Lowest daytime heart rate 39. Some of the triggered episodes were sinus bradycardia with a transition to a  junctional rhythm.  EKG:  The ekg ordered today demonstrates sinus bradycardia.  Recent Labs: 05/29/2020: B Natriuretic Peptide 97.8 05/30/2020: ALT 36; Hemoglobin 11.8; Platelets 278; TSH 2.197 05/31/2020: BUN 12; Creatinine, Ser 0.79; Magnesium 2.3; Potassium 4.2; Sodium 140  Recent Lipid Panel No results found for: CHOL, TRIG, HDL, CHOLHDL, VLDL, LDLCALC, LDLDIRECT  Physical Exam:    VS:  BP 118/80   Pulse (!) 46   Ht 5\' 5"  (1.651 m)   Wt 171 lb (77.6 kg)   SpO2 99%   BMI 28.46 kg/m     Wt Readings from Last 3 Encounters:  07/06/20 171 lb (77.6 kg)  06/30/20 172 lb (78 kg)  05/31/20 173 lb 3.2 oz (78.6 kg)     GEN:  Well nourished, well developed in no acute distress HEENT: Normal NECK: No JVD; No carotid bruits LYMPHATICS: No lymphadenopathy CARDIAC: RRR, no murmurs, rubs, gallops RESPIRATORY:  Clear to auscultation without rales, wheezing or rhonchi  ABDOMEN: Soft, non-tender, non-distended MUSCULOSKELETAL:  No edema; No deformity  SKIN: Warm and dry NEUROLOGIC:  Alert and oriented x 3 PSYCHIATRIC:  Normal affect   ASSESSMENT:    1. Symptomatic bradycardia   2. Chronotropic incompetence    PLAN:    In order of problems listed above:  1. Symptomatic Bradycardia Patient with resting bradycardia which is intermittently symptomatic. There is also a frequent junctional rhythm which are triggered as a symptomatic event on her Zio monitor. It is possible that the transient loss of AV synchrony is causing her symptoms. I do not see any evidence of malignant AV block. Given her most recent episode occurring during/shortly after exertion, will proceed with treadmill ECG to assess chronotropic competence. If that is normal but patient continues to have symptoms, can consider moving to DDD PPM to increase resting atrial rate or implanting a loop recorder to try to assess the heart rhythm during an episode. Will plan to touch base in 4 weeks to review treadmill ECG  results.   Medication Adjustments/Labs and Tests Ordered: Current medicines are reviewed at length with the patient today.  Concerns regarding medicines are outlined above.  Orders Placed This Encounter  Procedures  . Exercise Tolerance Test  . EKG 12-Lead   No orders of the defined types were placed in this encounter.   Patient Instructions  Medication Instructions:  Your physician recommends that you continue on your current medications as directed. Please refer to the Current Medication list given to you today.  *If you need a refill on your cardiac medications before your next appointment, please call your pharmacy*   Lab Work: None ordered    Testing/Procedures: Your physician has requested that you have an exercise tolerance test.  For further information please visit [email or web address] Please also follow instruction sheet, as given.   Follow-Up: At [facility], you and your health needs are our priority.  As part of our continuing mission to provide you with exceptional heart care, we have created designated Provider Care Teams.  These Care Teams include your primary Cardiologist (physician) and Advanced Practice Providers (APPs -  Physician Assistants and Nurse Practitioners) who all work together to provide you with the care you need, when you need it.  We recommend signing up for the patient portal called "MyChart".  Sign up information is provided on this After Visit Summary.  MyChart is used to connect with patients for Virtual Visits (Telemedicine).  Patients are able to view lab/test results, encounter notes, upcoming appointments, etc.  Non-urgent messages can be sent to your provider as well.   To learn more about what you can do with MyChart, go to [email or web address].    Your next appointment:   4 week(s) (after treadmill test)  The format for your next appointment:   In Person  Provider:   Dr. Lalla Brothers   Thank you for choosing [facility]!!      Other Instructions        Signed, Lanier Prude, MD  07/06/2020 10:38 AM    [facility]

## 2020-07-06 ENCOUNTER — Ambulatory Visit (INDEPENDENT_AMBULATORY_CARE_PROVIDER_SITE_OTHER): Payer: Self-pay | Admitting: Cardiology

## 2020-07-06 ENCOUNTER — Encounter: Payer: Self-pay | Admitting: Cardiology

## 2020-07-06 ENCOUNTER — Other Ambulatory Visit: Payer: Self-pay

## 2020-07-06 ENCOUNTER — Encounter: Payer: Self-pay | Admitting: *Deleted

## 2020-07-06 VITALS — BP 118/80 | HR 46 | Ht 65.0 in | Wt 171.0 lb

## 2020-07-06 DIAGNOSIS — R001 Bradycardia, unspecified: Secondary | ICD-10-CM

## 2020-07-06 DIAGNOSIS — I4589 Other specified conduction disorders: Secondary | ICD-10-CM

## 2020-07-06 NOTE — Addendum Note (Signed)
Addended by: Alba Kriesel T on: 07/06/2020 03:36 PM   Modules accepted: Level of Service  

## 2020-07-06 NOTE — Patient Instructions (Addendum)
Medication Instructions:  Your physician recommends that you continue on your current medications as directed. Please refer to the Current Medication list given to you today.  *If you need a refill on your cardiac medications before your next appointment, please call your pharmacy*   Lab Work:  COVID PRE- TEST: You will need a COVID TEST prior to the procedure:  LOCATION: [facility] Testing site.  DATE/TIME:  ________, anytime between 8:00 am - 1:00 pm   Testing/Procedures: Your physician has requested that you have an exercise tolerance test. For further information please visit [email or web address] Please also follow instruction sheet, as given.   DO NOT drink or eat foods with caffeine for 24 hours before the test. (Chocolate, coffee, tea, decaf coffee/tea, or energy drinks)  DO NOT smoke for 4 hours before your test.  If you use an inhaler, bring it with you to the test.  Wear comfortable shoes and clothing. Women do not wear dresses.     Follow-Up: At [facility], you and your health needs are our priority.  As part of our continuing mission to provide you with exceptional heart care, we have created designated Provider Care Teams.  These Care Teams include your primary Cardiologist (physician) and Advanced Practice Providers (APPs -  Physician Assistants and Nurse Practitioners) who all work together to provide you with the care you need, when you need it.  We recommend signing up for the patient portal called "MyChart".  Sign up information is provided on this After Visit Summary.  MyChart is used to connect with patients for Virtual Visits (Telemedicine).  Patients are able to view lab/test results, encounter notes, upcoming appointments, etc.  Non-urgent messages can be sent to your provider as well.   To learn more about what you can do with MyChart, go to [email or web address].    Your next appointment:   4 week(s) (after treadmill  test)  The format for your next appointment:   In Person  Provider:   Dr. Lalla Brothers   Thank you for choosing [facility]!!      Other Instructions

## 2020-07-13 ENCOUNTER — Other Ambulatory Visit: Payer: Self-pay

## 2020-07-13 ENCOUNTER — Other Ambulatory Visit
Admission: RE | Admit: 2020-07-13 | Discharge: 2020-07-13 | Disposition: A | Discharge status: Home / Self Care | Payer: HRSA Program | Source: Ambulatory Visit | Attending: Cardiology | Admitting: Cardiology

## 2020-07-13 DIAGNOSIS — Z01812 Encounter for preprocedural laboratory examination: Secondary | ICD-10-CM | POA: Diagnosis present

## 2020-07-13 DIAGNOSIS — Z20822 Contact with and (suspected) exposure to covid-19: Secondary | ICD-10-CM | POA: Diagnosis not present

## 2020-07-13 LAB — SARS CORONAVIRUS 2 (TAT 6-24 HRS): SARS Coronavirus 2: NEGATIVE

## 2020-07-14 ENCOUNTER — Other Ambulatory Visit: Payer: Self-pay

## 2020-07-15 ENCOUNTER — Telehealth: Payer: Self-pay | Admitting: Family

## 2020-07-15 ENCOUNTER — Other Ambulatory Visit: Payer: Self-pay

## 2020-07-15 ENCOUNTER — Telehealth: Payer: Self-pay | Admitting: Cardiology

## 2020-07-15 ENCOUNTER — Ambulatory Visit (INDEPENDENT_AMBULATORY_CARE_PROVIDER_SITE_OTHER): Payer: Self-pay

## 2020-07-15 DIAGNOSIS — I4589 Other specified conduction disorders: Secondary | ICD-10-CM

## 2020-07-15 MED ORDER — LISINOPRIL 20 MG PO TABS
10.0000 mg | ORAL_TABLET | Freq: Every day | ORAL | 1 refills | Status: DC
Start: 2020-07-15 — End: 2020-07-27

## 2020-07-15 NOTE — Telephone Encounter (Signed)
Patient is leaving town on 9/14 and had to cancel her appointment for 9/15/ Patient states she id going to be out of state for a few months and would like to be called with the results of her ETT from today when they are ready. Patient is also agreeable to a virtual visit if needed with Dr. Lalla Brothers

## 2020-07-15 NOTE — Telephone Encounter (Signed)
Called to check in on blood pressure.   BP at home with arm cuff: 152/90, 148/94, 165/100, 139/84, 138/87, 137/86, 151/90, 125/75, 127/80, 126/83, 123/80, 140/81.   Reports no lightheadedness. Tells me she is going to her daughter's home in [place] to help her and is planning to stay a couple months.  She was previously on antihypertensives including lisinopril 40 and 20 hydrochlorothiazide for hypertension but after Covid had lightheadedness, low blood pressure and these were subsequently discontinued.  Her blood pressure has steadily elevated since that time.  She has a large supply of lisinopril 20 mg tablets at home.  As such, due to need for improved blood pressure control we have agreed to start lisinopril 10 mg daily.  She will take 1/2 tablet of her 20 mg tablets.  I will call her in 2 weeks to check in on her blood pressure.  Alver Sorrow, NP

## 2020-07-19 ENCOUNTER — Telehealth: Payer: Self-pay | Admitting: Cardiology

## 2020-07-19 LAB — EXERCISE TOLERANCE TEST
Estimated workload: 13.4 METS
Exercise duration (min): 11 min
Exercise duration (sec): 7 s
MPHR: 159 {beats}/min
Peak HR: 136 {beats}/min
Percent HR: 85 %
RPE: 15
Rest HR: 62 {beats}/min

## 2020-07-20 ENCOUNTER — Ambulatory Visit: Payer: Self-pay | Admitting: Cardiology

## 2020-07-20 NOTE — Telephone Encounter (Signed)
Left message for Pt.  Advised follow up appt was changed to virtual with Dr. Lalla Brothers to discuss stress test.  07/27/20 at 9:40 am  Requested Pt call [place] office to confirm appt and to confirm mychart video visit vs. Phone call.  [place] office number left.

## 2020-07-20 NOTE — Telephone Encounter (Signed)
Patient gave verbal consent

## 2020-07-27 ENCOUNTER — Telehealth (INDEPENDENT_AMBULATORY_CARE_PROVIDER_SITE_OTHER): Payer: Self-pay | Admitting: Cardiology

## 2020-07-27 ENCOUNTER — Telehealth: Payer: Self-pay

## 2020-07-27 ENCOUNTER — Other Ambulatory Visit: Payer: Self-pay

## 2020-07-27 ENCOUNTER — Encounter: Payer: Self-pay | Admitting: Cardiology

## 2020-07-27 VITALS — BP 140/81 | HR 57

## 2020-07-27 DIAGNOSIS — R001 Bradycardia, unspecified: Secondary | ICD-10-CM

## 2020-07-27 NOTE — Telephone Encounter (Signed)
Called pt to update information for VV with Dr Lalla Brothers at 9:40. Had to LM on her VM.

## 2020-07-27 NOTE — Progress Notes (Signed)
Electrophysiology Virtual Visit via Telephone Note   This visit type was conducted due to national recommendations for restrictions regarding the COVID-19 Pandemic (e.g. social distancing) in an effort to limit this patient's exposure and mitigate transmission in our community.  Due to her co-morbid illnesses, this patient is at least at moderate risk for complications without adequate follow up.  This format is felt to be most appropriate for this patient at this time.  The patient did not have access to video technology/had technical difficulties with video requiring transitioning to audio format only (telephone).  All issues noted in this document were discussed and addressed.  No physical exam could be performed with this format.  Please refer to the patient's chart for her  consent to telehealth for [facility].    Date:  07/27/2020   ID:  Becky Barnes, DOB [birth year], MRN [id number] The patient was identified using 2 identifiers.  Patient Location: Home Provider Location: Office/Clinic  PCP:  West Carbo, MD  Cardiologist:  No primary care provider on file.  Electrophysiologist:  Lanier Prude, MD   Evaluation Performed:  Follow-Up Visit  Chief Complaint:  Symptomatic bradycardia follow up  History of Present Illness:    Becky Barnes is a 59 y.o. female with bradycardia who presents for follow up. I last saw the patient 9/1 in clinic. She told me at that visit of episodes of low heart rates. One episode occurred after a prolonged breath hold while swimming. She arrived to the end of the pool and felt terrible with nausea and weakness. On her Zio monitor there were intermittent episodes of a junctional rhythm that corresponded to symptomatic triggers.   She has done well since our last visit. She is in [place] visiting her daughter. She has experienced no further episodes of low heart rates, syncope or presyncope.  The patient does not have symptoms  concerning for COVID-19 infection (fever, chills, cough, or new shortness of breath).    Past Medical History:  Diagnosis Date   Bradycardia    COVID-19 11/2019   Hypertension    Past Surgical History:  Procedure Laterality Date   ABDOMINAL HYSTERECTOMY       Current Meds  Medication Sig   atorvastatin (LIPITOR) 40 MG tablet Take 40 mg by mouth daily.   lisinopril (ZESTRIL) 20 MG tablet Take 10 mg by mouth daily.     Allergies:   Patient has no known allergies.   Social History   Tobacco Use   Smoking status: Former Smoker    Quit date: 01/22/1990    Years since quitting: 30.5   Smokeless tobacco: Never Used  Vaping Use   Vaping Use: Never used  Substance Use Topics   Alcohol use: Never   Drug use: Never     Family Hx: The patient's family history includes Congestive Heart Failure in her mother; Diabetes in her mother; Hypertension in her mother; Pancreatic cancer in her mother; Prostate cancer in her father.  ROS:   Please see the history of present illness.     All other systems reviewed and are negative.   Prior CV studies:   The following studies were reviewed today:  07/15/2020 ETT personally reviewed Sinus rhythm. Good HR response to exercise with peak 136bpm. Normal heart rate recovery. Hypertensive during exercise. Excellent exercise capacity at 13.4 METS.  05/30/2020 Echo EF normal RV normal No significant valvular abnormalities  Labs/Other Tests and Data Reviewed:      Recent Labs: 05/29/2020: B  Natriuretic Peptide 97.8 05/30/2020: ALT 36; Hemoglobin 11.8; Platelets 278; TSH 2.197 05/31/2020: BUN 12; Creatinine, Ser 0.79; Magnesium 2.3; Potassium 4.2; Sodium 140   Recent Lipid Panel No results found for: CHOL, TRIG, HDL, CHOLHDL, LDLCALC, LDLDIRECT  Wt Readings from Last 3 Encounters:  07/06/20 171 lb (77.6 kg)  06/30/20 172 lb (78 kg)  05/31/20 173 lb 3.2 oz (78.6 kg)     Objective:    Vital Signs:  BP 140/81    Pulse (!) 57     GEN:  no acute distress RESPIRATORY:  normal respiratory effor, symmetric expansion   ASSESSMENT & PLAN:    1. 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 Given her normal response to exercise on the ETT and no evidence of AV nodal disease I do NOT think a pacemaker is indicated at this time. Would recommend she continue regular aerobic exercise.    Medication Adjustments/Labs and Tests Ordered: Current medicines are reviewed at length with the patient today.  Concerns regarding medicines are outlined above.   Tests Ordered: No orders of the defined types were placed in this encounter.   Medication Changes: No orders of the defined types were placed in this encounter.   Follow Up:  In Person prn  Signed, Lanier Prude, MD  07/27/2020 10:44 AM    [facility]

## 2020-07-28 ENCOUNTER — Telehealth: Payer: Self-pay | Admitting: Family

## 2020-07-28 NOTE — Telephone Encounter (Signed)
Called Becky Barnes to check in on BP. BP goal <130/80.   She was previously on multiple antihypertensives with lightheadedness and dizziness.  They have since been discontinued.  Clinic visit 06/30/2020 she noted mildly elevated blood pressures when checked at home and we discussed plan to monitor carefully and report blood pressure consistently greater than 130/80.  She is not presently on any antihypertensive medications. Her blood pressures have been well controlled in clinic.  Left voicemail to request call back if she is having difficulties of blood pressure.  Alver Sorrow, NP

## 2020-10-23 ENCOUNTER — Ambulatory Visit
Admission: EM | Admit: 2020-10-23 | Discharge: 2020-10-23 | Disposition: A | Discharge status: Home / Self Care | Payer: Self-pay | Attending: Family Medicine | Admitting: Family Medicine

## 2020-10-23 ENCOUNTER — Other Ambulatory Visit: Payer: Self-pay

## 2020-10-23 ENCOUNTER — Encounter: Payer: Self-pay | Admitting: Gynecology

## 2020-10-23 DIAGNOSIS — R21 Rash and other nonspecific skin eruption: Secondary | ICD-10-CM

## 2020-10-23 DIAGNOSIS — H5712 Ocular pain, left eye: Secondary | ICD-10-CM

## 2020-10-23 MED ORDER — DESONIDE 0.05 % EX CREA
TOPICAL_CREAM | Freq: Two times a day (BID) | CUTANEOUS | 0 refills | Status: AC
Start: 2020-10-23 — End: ?

## 2020-10-23 MED ORDER — VALACYCLOVIR HCL 1 G PO TABS: 1000.0000 mg | ORAL_TABLET | Freq: Three times a day (TID) | ORAL | 0 refills | Status: AC

## 2020-10-23 NOTE — Discharge Instructions (Signed)
Medications as prescribed.  Let us know if this does not improve or worsens.  Take care  Dr. Adriana Simas

## 2020-10-23 NOTE — ED Provider Notes (Signed)
[facility]    CSN: [id number] Arrival date & time: 10/23/20  1250      History   Chief Complaint Chief Complaint  Patient presents with   Eye Problem   HPI  59 year old female presents with the above complaint.  Patient reports pain below her left eye.  Some associated itching.  Has been going on for the past 3 days.  Patient reports that she has some pain when she moves her eye.  She has applied some topical hydrocortisone without relief.  She has also used some eyedrops without relief.  No vision changes.  No eye drainage.  No other complaints.  Past Medical History:  Diagnosis Date   Bradycardia    COVID-19 11/2019   Hypertension     Patient Active Problem List   Diagnosis Date Noted   Essential hypertension 05/29/2020   Hyperlipemia 05/29/2020   Bradycardia 05/29/2020    Past Surgical History:  Procedure Laterality Date   ABDOMINAL HYSTERECTOMY      OB History   No obstetric history on file.      Home Medications    Prior to Admission medications   Medication Sig Start Date End Date Taking? Authorizing Provider  atorvastatin (LIPITOR) 40 MG tablet Take 40 mg by mouth daily. 05/19/20  Yes [provider]  desonide (DESOWEN) 0.05 % cream Apply topically 2 (two) times daily. Do not use more than 2 weeks consecutively. 10/23/20   Tommie Sams, DO  valACYclovir (VALTREX) 1000 MG tablet Take 1 tablet (1,000 mg total) by mouth 3 (three) times daily. 10/23/20   Tommie Sams, DO    Family History Family History  Problem Relation Age of Onset   Pancreatic cancer Mother    Diabetes Mother    Hypertension Mother    Congestive Heart Failure Mother    Prostate cancer Father     Social History Social History   Tobacco Use   Smoking status: Former Smoker    Quit date: 01/22/1990    Years since quitting: 30.7   Smokeless tobacco: Never Used  Vaping Use   Vaping Use: Never used  Substance Use Topics   Alcohol use:  Never   Drug use: Never     Allergies   Patient has no known allergies.   Review of Systems Review of Systems Per HPI  Physical Exam Triage Vital Signs ED Triage Vitals  Enc Vitals Group     BP 10/23/20 1304 118/78     Pulse Rate 10/23/20 1304 60     Resp 10/23/20 1304 16     Temp 10/23/20 1304 98.6 F (37 C)     Temp Source 10/23/20 1304 Oral     SpO2 10/23/20 1304 100 %     Weight 10/23/20 1303 170 lb (77.1 kg)     Height 10/23/20 1303 5\' 5"  (1.651 m)     Head Circumference --      Peak Flow --      Pain Score 10/23/20 1303 4     Pain Loc --      Pain Edu? --      Excl. in GC? --    Updated Vital Signs BP 118/78 (BP Location: Left Arm)    Pulse 60    Temp 98.6 F (37 C) (Oral)    Resp 16    Ht 5\' 5"  (1.651 m)    Wt 77.1 kg    SpO2 100%    BMI 28.29 kg/m   Visual Acuity  Right Eye Distance: 20/25 Left Eye Distance: 20/25 Bilateral Distance: 20/25 (with corrective lens)  Right Eye Near:   Left Eye Near:    Bilateral Near:     Physical Exam Vitals and nursing note reviewed.  Constitutional:      General: She is not in acute distress.    Appearance: Normal appearance. She is not ill-appearing.  HENT:     Head: Normocephalic and atraumatic.  Eyes:     General:        Right eye: No discharge.        Left eye: No discharge.     Conjunctiva/sclera: Conjunctivae normal.     Pupils: Pupils are equal, round, and reactive to light.      Comments: Patient has a few raised areas underneath the left eyelid.  Mild erythema.  Pulmonary:     Effort: Pulmonary effort is normal. No respiratory distress.  Neurological:     Mental Status: She is alert.  Psychiatric:        Mood and Affect: Mood normal.        Behavior: Behavior normal.      UC Treatments / Results  Labs (all labs ordered are listed, but only abnormal results are displayed) Labs Reviewed - No data to display  EKG   Radiology No results found.  Procedures Procedures (including critical  care time)  Medications Ordered in UC Medications - No data to display  Initial Impression / Assessment and Plan / UC Course  I have reviewed the triage vital signs and the nursing notes.  Pertinent labs & imaging results that were available during my care of the patient were reviewed by me and considered in my medical decision making (see chart for details).    59 year old female presents with left eye pain and a slightly raised erythematous area below the left eyelid.  Eye exam normal.  Patient concerned about the possibility of shingles.  I am placing her empirically on Valtrex to cover for potential shingles.  Topical desonide for the rash. Supportive care.  Final Clinical Impressions(s) / UC Diagnoses   Final diagnoses:  Left eye pain  Rash and nonspecific skin eruption     Discharge Instructions     Medications as prescribed.  Let us know if this does not improve or worsens.  Take care  Dr. Adriana Simas    ED Prescriptions    Medication Sig Dispense Auth. Provider   desonide (DESOWEN) 0.05 % cream Apply topically 2 (two) times daily. Do not use more than 2 weeks consecutively. 30 g Boris Engelmann G, DO   valACYclovir (VALTREX) 1000 MG tablet Take 1 tablet (1,000 mg total) by mouth 3 (three) times daily. 21 tablet Everlene Other G, DO     PDMP not reviewed this encounter.   Tommie Sams, [place] 10/23/20 1439

## 2020-10-23 NOTE — ED Triage Notes (Signed)
Patient c/o left eye painful and itching x3 days ago.

## 2020-11-21 ENCOUNTER — Other Ambulatory Visit: Payer: Self-pay

## 2020-11-21 ENCOUNTER — Ambulatory Visit
Admission: EM | Admit: 2020-11-21 | Discharge: 2020-11-21 | Disposition: A | Discharge status: Home / Self Care | Payer: Self-pay | Attending: Internal Medicine | Admitting: Internal Medicine

## 2020-11-21 DIAGNOSIS — R300 Dysuria: Secondary | ICD-10-CM | POA: Insufficient documentation

## 2020-11-21 DIAGNOSIS — N3001 Acute cystitis with hematuria: Secondary | ICD-10-CM | POA: Insufficient documentation

## 2020-11-21 LAB — URINALYSIS, COMPLETE (UACMP) WITH MICROSCOPIC
Bilirubin Urine: NEGATIVE
Glucose, UA: NEGATIVE mg/dL
Ketones, ur: NEGATIVE mg/dL
Nitrite: NEGATIVE
Protein, ur: NEGATIVE mg/dL
Specific Gravity, Urine: >1.03 — ABNORMAL HIGH (ref 1.005–1.030)
pH: 5 (ref 5.0–8.0)

## 2020-11-21 MED ORDER — NITROFURANTOIN MONOHYD MACRO 100 MG PO CAPS: 100.0000 mg | ORAL_CAPSULE | Freq: Two times a day (BID) | ORAL | 0 refills | Status: AC

## 2020-11-21 NOTE — ED Provider Notes (Signed)
[facility]    CSN: [id number] Arrival date & time: 11/21/20  1325      History   Chief Complaint Chief Complaint  Patient presents with  . Dysuria    HPI Becky Barnes is a 60 y.o. female presenting for dysuria and suprapubic pain/pressure x 2-3 days.  She denies any urinary urgency, frequency, hematuria, vaginal discharge/odor/itching or vaginal lesions.  Patient denies any fever, fatigue, back pain.  Patient states she has had UTIs in the past and believes that she has a UTI currently.  Has taken over-the-counter herbal supplements without improvement in condition.  She has no other complaints or concerns.  HPI  Past Medical History:  Diagnosis Date  . Bradycardia   . COVID-19 11/2019  . Hypertension     Patient Active Problem List   Diagnosis Date Noted  . Essential hypertension 05/29/2020  . Hyperlipemia 05/29/2020  . Bradycardia 05/29/2020    Past Surgical History:  Procedure Laterality Date  . ABDOMINAL HYSTERECTOMY      OB History   No obstetric history on file.      Home Medications    Prior to Admission medications   Medication Sig Start Date End Date Taking? Authorizing Provider  nitrofurantoin, macrocrystal-monohydrate, (MACROBID) 100 MG capsule Take 1 capsule (100 mg total) by mouth 2 (two) times daily for 5 days. 11/21/20 11/26/20 Yes Eusebio Friendly B, PA-C  atorvastatin (LIPITOR) 40 MG tablet Take 40 mg by mouth daily. 05/19/20   [provider]  desonide (DESOWEN) 0.05 % cream Apply topically 2 (two) times daily. Do not use more than 2 weeks consecutively. 10/23/20   Tommie Sams, DO  valACYclovir (VALTREX) 1000 MG tablet Take 1 tablet (1,000 mg total) by mouth 3 (three) times daily. 10/23/20   Tommie Sams, DO    Family History Family History  Problem Relation Age of Onset  . Pancreatic cancer Mother   . Diabetes Mother   . Hypertension Mother   . Congestive Heart Failure Mother   . Prostate cancer Father      Social History Social History   Tobacco Use  . Smoking status: Former Smoker    Quit date: 01/22/1990    Years since quitting: 30.8  . Smokeless tobacco: Never Used  Vaping Use  . Vaping Use: Never used  Substance Use Topics  . Alcohol use: Never  . Drug use: Never     Allergies   Patient has no known allergies.   Review of Systems Review of Systems  Constitutional: Negative for fatigue and fever.  Gastrointestinal: Positive for abdominal pain (suprapubic). Negative for diarrhea, nausea and vomiting.  Genitourinary: Positive for dysuria. Negative for decreased urine volume, flank pain, frequency, hematuria, pelvic pain, urgency, vaginal bleeding, vaginal discharge and vaginal pain.  Musculoskeletal: Negative for back pain.  Skin: Negative for rash.     Physical Exam Triage Vital Signs ED Triage Vitals  Enc Vitals Group     BP 11/21/20 1432 (!) 144/74     Pulse Rate 11/21/20 1432 (!) 51     Resp 11/21/20 1432 16     Temp 11/21/20 1432 98 F (36.7 C)     Temp Source 11/21/20 1432 Oral     SpO2 11/21/20 1432 100 %     Weight 11/21/20 1433 172 lb (78 kg)     Height 11/21/20 1433 5\' 5"  (1.651 m)     Head Circumference --      Peak Flow --  Pain Score 11/21/20 1433 2     Pain Loc --      Pain Edu? --      Excl. in GC? --    No data found.  Updated Vital Signs BP (!) 144/74 (BP Location: Left Arm)   Pulse (!) 51   Temp 98 F (36.7 C) (Oral)   Resp 16   Ht 5\' 5"  (1.651 m)   Wt 172 lb (78 kg)   SpO2 100%   BMI 28.62 kg/m        Physical Exam Vitals and nursing note reviewed.  Constitutional:      General: She is not in acute distress.    Appearance: Normal appearance. She is not ill-appearing or toxic-appearing.  HENT:     Head: Normocephalic and atraumatic.  Eyes:     General: No scleral icterus.       Right eye: No discharge.        Left eye: No discharge.     Conjunctiva/sclera: Conjunctivae normal.  Cardiovascular:     Rate and  Rhythm: Regular rhythm. Bradycardia present.     Heart sounds: Normal heart sounds.  Pulmonary:     Effort: Pulmonary effort is normal. No respiratory distress.     Breath sounds: Normal breath sounds.  Abdominal:     Palpations: Abdomen is soft.     Tenderness: There is no abdominal tenderness. There is no right CVA tenderness or left CVA tenderness.  Musculoskeletal:     Cervical back: Neck supple.  Skin:    General: Skin is dry.  Neurological:     General: No focal deficit present.     Mental Status: She is alert. Mental status is at baseline.     Motor: No weakness.     Gait: Gait normal.  Psychiatric:        Mood and Affect: Mood normal.        Behavior: Behavior normal.        Thought Content: Thought content normal.      UC Treatments / Results  Labs (all labs ordered are listed, but only abnormal results are displayed) Labs Reviewed  URINALYSIS, COMPLETE (UACMP) WITH MICROSCOPIC - Abnormal; Notable for the following components:      Result Value   Color, Urine STRAW (*)    APPearance HAZY (*)    Specific Gravity, Urine >1.030 (*)    Hgb urine dipstick TRACE (*)    Leukocytes,Ua TRACE (*)    Bacteria, UA RARE (*)    All other components within normal limits  URINE CULTURE    EKG   Radiology No results found.  Procedures Procedures (including critical care time)  Medications Ordered in UC Medications - No data to display  Initial Impression / Assessment and Plan / UC Course  I have reviewed the triage vital signs and the nursing notes.  Pertinent labs & imaging results that were available during my care of the patient were reviewed by me and considered in my medical decision making (see chart for details).   Urinalysis positive for specific gravity greater than 1.030, trace blood, and trace leukocytes.  Will send urine for culture.  We will treat for suspected UTI with Macrobid.  Advised patient we will call in amend treatment if urine culture does not  show susceptibility to the Macrobid.  Advised increased rest and fluids.  Return and ED precautions discussed for UTIs.   Final Clinical Impressions(s) / UC Diagnoses   Final diagnoses:  Acute cystitis  with hematuria  Dysuria     Discharge Instructions     UTI: Based on either symptoms or urinalysis, you may have a urinary tract infection. We will send the urine for culture and call with results in a few days. Begin antibiotics at this time. Your symptoms should be much improved over the next 2-3 days. Increase rest and fluid intake. If for some reason symptoms are worsening or not improving after a couple of days or the urine culture determines the antibiotics you are taking will not treat the infection, the antibiotics may be changed. Return or go to ER for fever, back pain, worsening urinary pain, discharge, increased blood in urine. May take Tylenol or Motrin OTC for pain relief or consider AZO if no contraindications     ED Prescriptions    Medication Sig Dispense Auth. Provider   nitrofurantoin, macrocrystal-monohydrate, (MACROBID) 100 MG capsule Take 1 capsule (100 mg total) by mouth 2 (two) times daily for 5 days. 10 capsule Shirlee Latch, PA-C     PDMP not reviewed this encounter.   Shirlee Latch, PA-C 11/21/20 1516

## 2020-11-21 NOTE — ED Triage Notes (Signed)
Pt reports burning with urination and pressure in her vaginal area. Also states the pain is worse at the end of her urination.

## 2020-11-21 NOTE — Discharge Instructions (Addendum)

## 2020-11-24 ENCOUNTER — Telehealth (HOSPITAL_COMMUNITY): Payer: Self-pay | Admitting: Emergency Medicine

## 2020-11-24 LAB — URINE CULTURE: Culture: >=100000 — AB

## 2020-11-24 MED ORDER — SULFAMETHOXAZOLE-TRIMETHOPRIM 800-160 MG PO TABS: 1.0000 | ORAL_TABLET | Freq: Two times a day (BID) | ORAL | 0 refills | Status: DC

## 2020-11-27 ENCOUNTER — Other Ambulatory Visit: Payer: Self-pay

## 2020-11-27 ENCOUNTER — Encounter: Payer: Self-pay | Admitting: Emergency Medicine

## 2020-11-27 ENCOUNTER — Ambulatory Visit
Admission: EM | Admit: 2020-11-27 | Discharge: 2020-11-27 | Disposition: A | Discharge status: Home / Self Care | Payer: Self-pay | Attending: Emergency Medicine | Admitting: Emergency Medicine

## 2020-11-27 DIAGNOSIS — H0100A Unspecified blepharitis right eye, upper and lower eyelids: Secondary | ICD-10-CM | POA: Insufficient documentation

## 2020-11-27 DIAGNOSIS — N390 Urinary tract infection, site not specified: Secondary | ICD-10-CM | POA: Insufficient documentation

## 2020-11-27 DIAGNOSIS — H0100B Unspecified blepharitis left eye, upper and lower eyelids: Secondary | ICD-10-CM | POA: Insufficient documentation

## 2020-11-27 DIAGNOSIS — L239 Allergic contact dermatitis, unspecified cause: Secondary | ICD-10-CM | POA: Insufficient documentation

## 2020-11-27 LAB — URINALYSIS, COMPLETE (UACMP) WITH MICROSCOPIC
Bacteria, UA: NONE SEEN
Bilirubin Urine: NEGATIVE
Glucose, UA: NEGATIVE mg/dL
Ketones, ur: NEGATIVE mg/dL
Nitrite: NEGATIVE
Protein, ur: NEGATIVE mg/dL
Specific Gravity, Urine: 1.025 (ref 1.005–1.030)
pH: 5.5 (ref 5.0–8.0)

## 2020-11-27 MED ORDER — SULFAMETHOXAZOLE-TRIMETHOPRIM 800-160 MG PO TABS: 1.0000 | ORAL_TABLET | Freq: Two times a day (BID) | ORAL | 0 refills | Status: AC

## 2020-11-27 MED ORDER — ERYTHROMYCIN 5 MG/GM OP OINT: TOPICAL_OINTMENT | OPHTHALMIC | 0 refills | Status: AC

## 2020-11-27 NOTE — ED Notes (Signed)
When I went to discharge pt she states she forgot to tell provider about urinary symptoms she had been having. She states it burns when she urinates and has urinary retention. She had recent UTI and treated.

## 2020-11-27 NOTE — Discharge Instructions (Addendum)
You were seen for eye irritation and are being treated for a blepharitis and allergic dermatitis.   Use warm compresses to bilateral eyes to soothe.  Over-the-counter allergy medication such as cetirizine (Zyrtec) can help with the itchiness.  If your eye discomfort continued for the next 3-5 days, seek further evaluation from optometrist.  If the bumps on your face do not resolve in approximately 3-5 days, seek further evaluation from a dermatologist.  Take the antibiotics as prescribed until they're finished. If you think you're having a reaction, stop the medication, take benadryl and go to the nearest urgent care/emergency room. Take a probiotic while taking the antibiotic to decrease the chances of stomach upset.   Take care, Dr. Sharlet Salina, NP-c

## 2020-11-27 NOTE — ED Provider Notes (Signed)
[facility] [place]   Name: Becky Barnes DOB: 28-Jan-1961 MRN: [id number] CSN: [id number] PCP: West Carbo, MD  Arrival date and time:  11/27/20 1127  Chief Complaint:  Eye Problem (left) and Dysuria   NOTE: Prior to seeing the patient today, I have reviewed the triage nursing documentation and vital signs. Clinical staff has updated patient's PMH/PSHx, current medication list, and drug allergies/intolerances to ensure comprehensive history available to assist in medical decision making.   History:   HPI: Becky Barnes is a 60 y.o. female who presents today with complaints of irritating bumps underneath bilateral eyes.  Patient was previously by Dr. Adriana Simas at this facility on 10/23/2020 and was given a topical steroid cream and Valtrex for possible shingles infection.  Patient states she has been using her topical cream and has completed her Valtrex, but the bumps that were slowly underneath her left eye has not spread to bilateral eyes.  She is also states her eyes burn pursing in the morning and has noticed some discharge.  She denies any vision changes or pain.  Prior to discharge, patient also mentioned she is still having dysuria symptoms to the CMA.  She was seen at this facility on 11/21/2020 Athena Masse.    Past Medical History:  Diagnosis Date  . Bradycardia   . COVID-19 11/2019  . Hypertension     Past Surgical History:  Procedure Laterality Date  . ABDOMINAL HYSTERECTOMY      Family History  Problem Relation Age of Onset  . Pancreatic cancer Mother   . Diabetes Mother   . Hypertension Mother   . Congestive Heart Failure Mother   . Prostate cancer Father     Social History   Tobacco Use  . Smoking status: Former Smoker    Quit date: 01/22/1990    Years since quitting: 30.8  . Smokeless tobacco: Never Used  Vaping Use  . Vaping Use: Never used  Substance Use Topics  . Alcohol use: Never  . Drug use: Never     Patient Active Problem List   Diagnosis Date Noted  . Essential hypertension 05/29/2020  . Hyperlipemia 05/29/2020  . Bradycardia 05/29/2020    Home Medications:    Current Meds  Medication Sig  . atorvastatin (LIPITOR) 40 MG tablet Take 40 mg by mouth daily.  Marland Kitchen desonide (DESOWEN) 0.05 % cream Apply topically 2 (two) times daily. Do not use more than 2 weeks consecutively.  Marland Kitchen erythromycin ophthalmic ointment Place a 1/2 inch ribbon of ointment into the lower eyelid.    Allergies:   Patient has no known allergies.  Review of Systems (ROS): Review of Systems  Eyes: Positive for discharge and itching. Negative for photophobia and redness.  Genitourinary: Positive for dysuria.  Skin: Positive for rash.     Vital Signs: Today's Vitals   11/27/20 1209 11/27/20 1212  BP:  108/69  Pulse:  64  Resp:  14  Temp:  98.6 F (37 C)  TempSrc:  Oral  SpO2:  99%  Weight: 174 lb (78.9 kg)   Height: 5\' 5"  (1.651 m)   PainSc: 4      Physical Exam: Physical Exam Vitals and nursing note reviewed.  Constitutional:      Appearance: Normal appearance.  Eyes:     General: Lids are normal.     Extraocular Movements: Extraocular movements intact.     Conjunctiva/sclera: Conjunctivae normal.     Comments: Mild blepharitis seen to bilateral lower legs.  Cardiovascular:     Rate and Rhythm: Normal rate and regular rhythm.     Pulses: Normal pulses.     Heart sounds: Normal heart sounds.  Pulmonary:     Effort: Pulmonary effort is normal.     Breath sounds: Normal breath sounds.  Skin:    General: Skin is warm and dry.     Findings: Rash present. Rash is papular.     Comments: Two very small papules seen to zygomatic area.  Neurological:     General: No focal deficit present.     Mental Status: She is alert and oriented to person, place, and time.  Psychiatric:        Mood and Affect: Mood normal.        Behavior: Behavior normal.      Urgent Care Treatments / Results:    LABS: PLEASE NOTE: all labs that were ordered this encounter are listed, however only abnormal results are displayed. Labs Reviewed  URINALYSIS, COMPLETE (UACMP) WITH MICROSCOPIC - Abnormal; Notable for the following components:      Result Value   Hgb urine dipstick TRACE (*)    Leukocytes,Ua TRACE (*)    All other components within normal limits    EKG: -None  RADIOLOGY: No results found.  PROCEDURES: Procedures  MEDICATIONS RECEIVED THIS VISIT: Medications - No data to display  PERTINENT CLINICAL COURSE NOTES/UPDATES:   Initial Impression / Assessment and Plan / Urgent Care Course:  Pertinent labs & imaging results that were available during my care of the patient were personally reviewed by me and considered in my medical decision making (see lab/imaging section of note for values and interpretations).  Becky Barnes is a 60 y.o. female who presents to [facility] today with complaints of eye irritation and urinary discomfort, diagnosed with blepharitis and recurrent urinary tract infection, and treated as such with the medications below. NP and patient reviewed discharge instructions below during visit.   Patient is well appearing overall in clinic today. She does not appear to be in any acute distress. Presenting symptoms (see HPI) and exam as documented above.   I have reviewed the follow up and strict return precautions for any new or worsening symptoms. Patient is aware of symptoms that would be deemed urgent/emergent, and would thus require further evaluation either here or in the emergency department. At the time of discharge, she verbalized understanding and consent with the discharge plan as it was reviewed with her. All questions were fielded by provider and/or clinic staff prior to patient discharge.    Final Clinical Impressions / Urgent Care Diagnoses:   Final diagnoses:  Blepharitis of upper and lower eyelids of both eyes, unspecified type   Allergic dermatitis    New Prescriptions:  [place] Controlled Substance Registry consulted? Not Applicable  Meds ordered this encounter  Medications  . erythromycin ophthalmic ointment    Sig: Place a 1/2 inch ribbon of ointment into the lower eyelid.    Dispense:  3.5 g    Refill:  0  . sulfamethoxazole-trimethoprim (BACTRIM DS) 800-160 MG tablet    Sig: Take 1 tablet by mouth 2 (two) times daily for 3 days.    Dispense:  6 tablet    Refill:  0      Discharge Instructions     You were seen for eye irritation and are being treated for a blepharitis and allergic dermatitis.   Use warm compresses to bilateral eyes to soothe.  Over-the-counter allergy medication such as  cetirizine (Zyrtec) can help with the itchiness.  If your eye discomfort continued for the next 3-5 days, seek further evaluation from optometrist.  If the bumps on your face do not resolve in approximately 3-5 days, seek further evaluation from a dermatologist.  Take the antibiotics as prescribed until they're finished. If you think you're having a reaction, stop the medication, take benadryl and go to the nearest urgent care/emergency room. Take a probiotic while taking the antibiotic to decrease the chances of stomach upset.   Take care, Dr. Sharlet Salina, NP-c      Recommended Follow up Care:  Patient encouraged to follow up with the following provider within the specified time frame, or sooner as dictated by the severity of her symptoms. As always, she was instructed that for any urgent/emergent care needs, she should seek care either here or in the emergency department for more immediate evaluation.   Bailey Mech, DNP, NP-c    Bailey Mech, NP 11/27/20 1328

## 2020-11-27 NOTE — ED Triage Notes (Signed)
Patient reports some bumps under left eye that started a month ago.  Patient was given a cream and used it for 2 weeks and states that it has not improved.

## 2020-12-22 IMAGING — CT CT HEAD W/O CM
3 of 4 series · 14 of 47 positions shown, 16 images · non-contrast
Comparison: No priors.

CLINICAL DATA: 59-year-old female with history of dizziness and
lightheadedness. Weakness.

EXAM:
CT HEAD WITHOUT CONTRAST
TECHNIQUE: Contiguous axial images were obtained from the base of the skull
through the vertex without intravenous contrast.

[Series 4: head wo recon · axial · 0.35mm/px · z∈[-132,+16]mm · 8 of 36 slices shown, 10 images]
[im 3/36  brain]
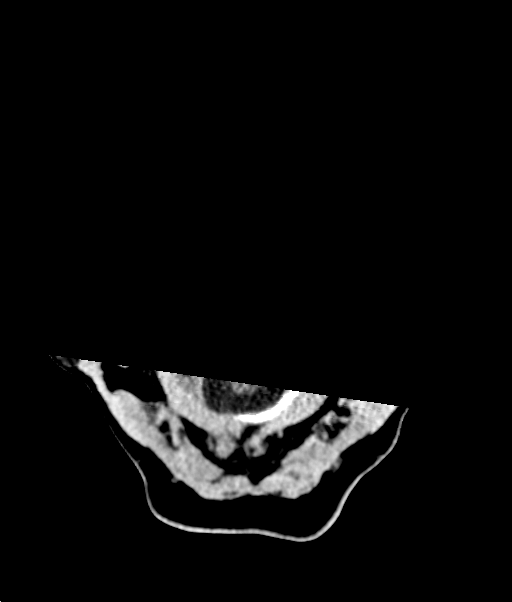
[im 3/36  bone]
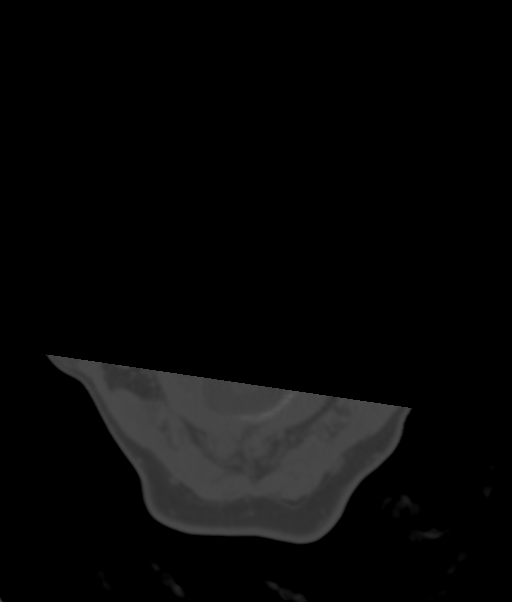
[im 8/36  brain]
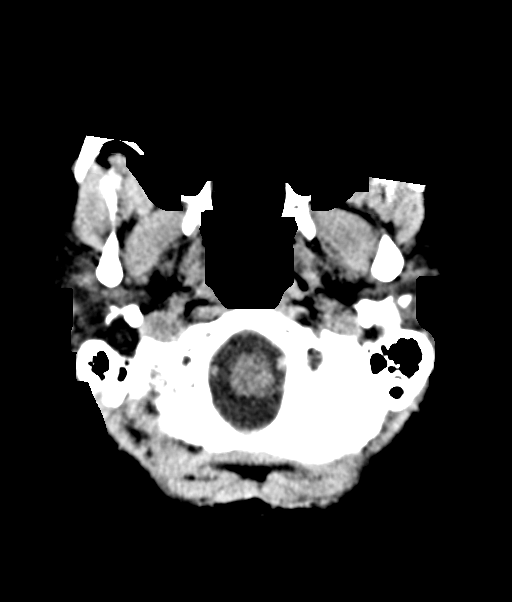
[im 12/36  brain]
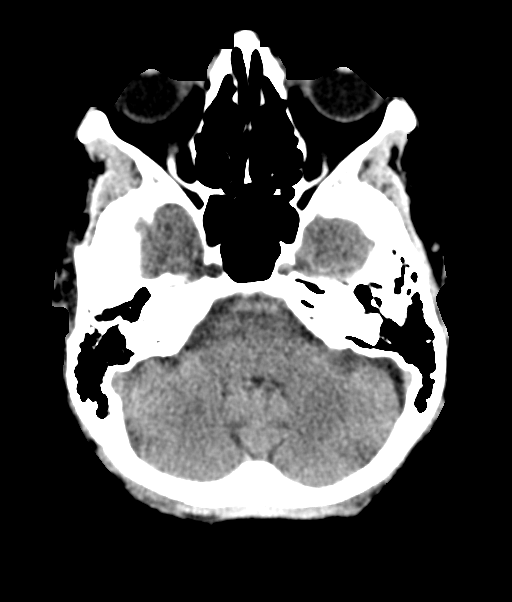
[im 17/36  brain]
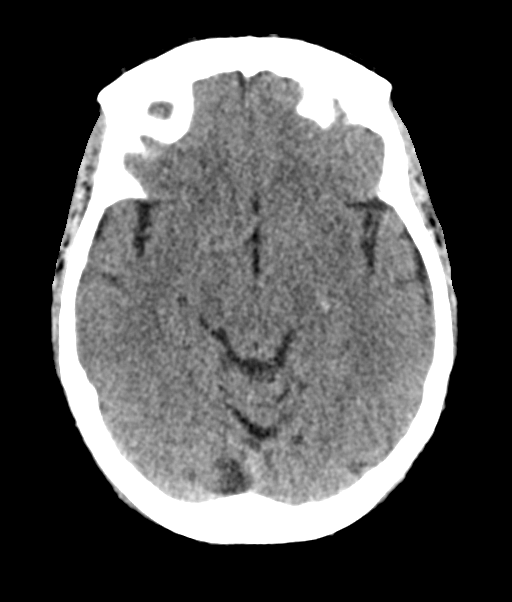
[im 19/36  brain]
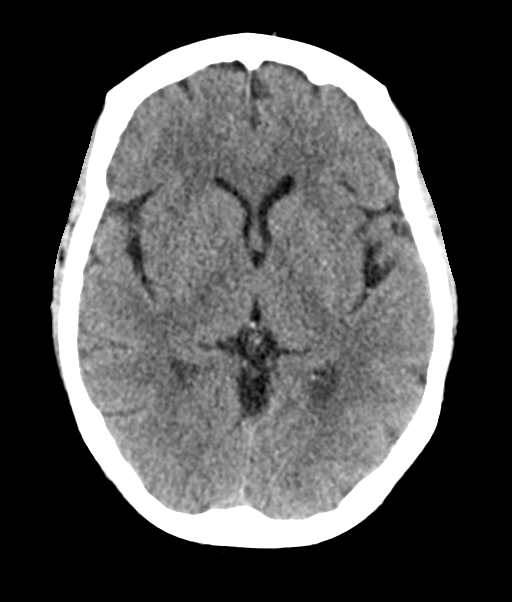
[im 19/36  bone]
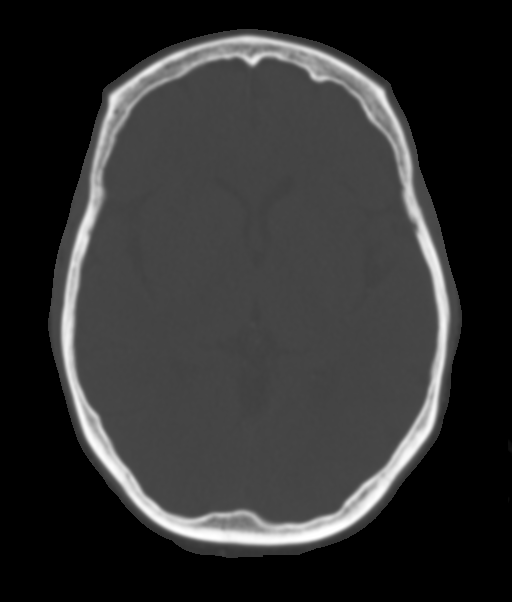
[im 24/36  brain]
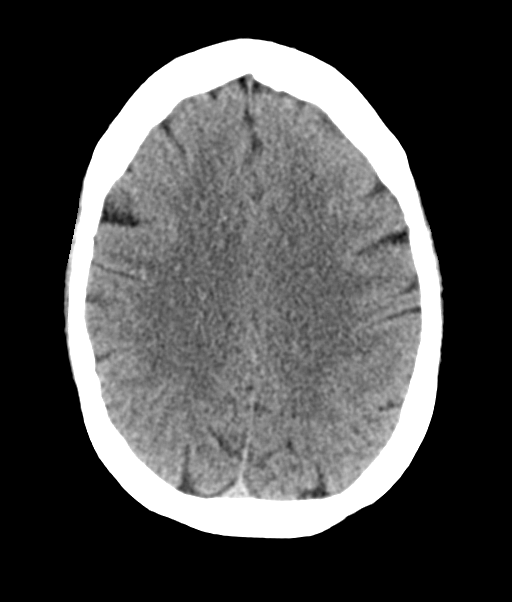
[im 29/36  brain]
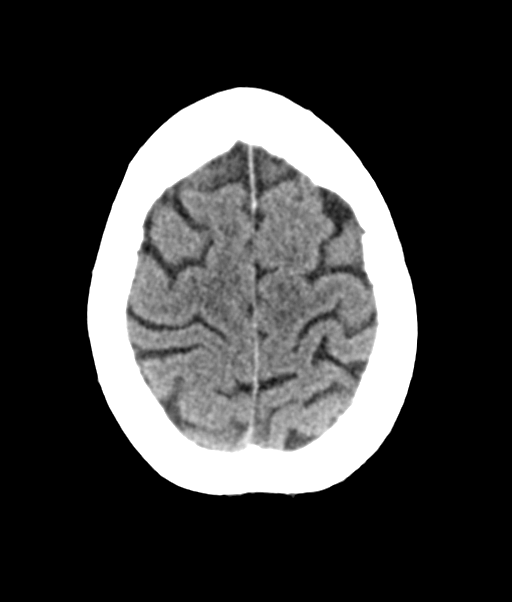
[im 33/36  brain]
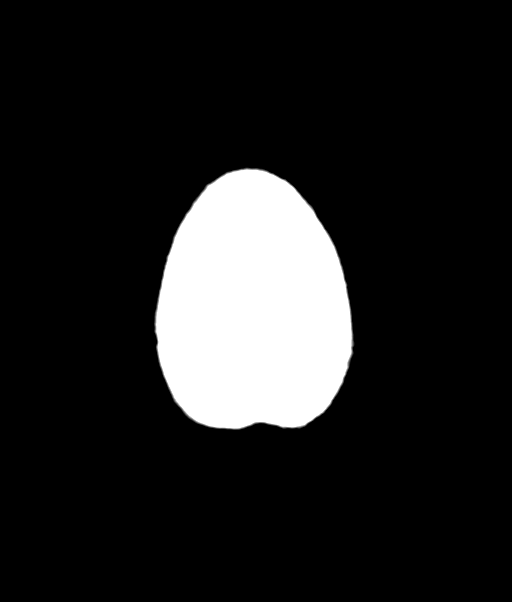

[Series 6: coronal soft tissue · coronal · 0.32mm/px · 3 of 69 slices shown]
[im 23/69  brain]
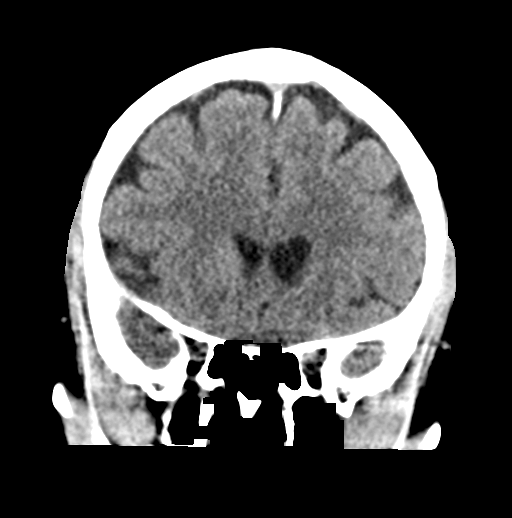
[im 31/69  brain]
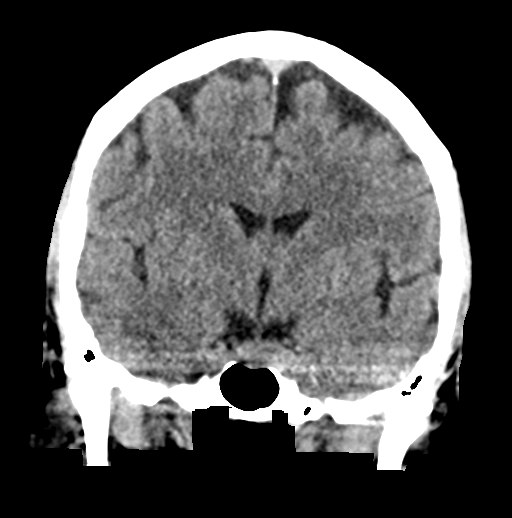
[im 38/69  brain]
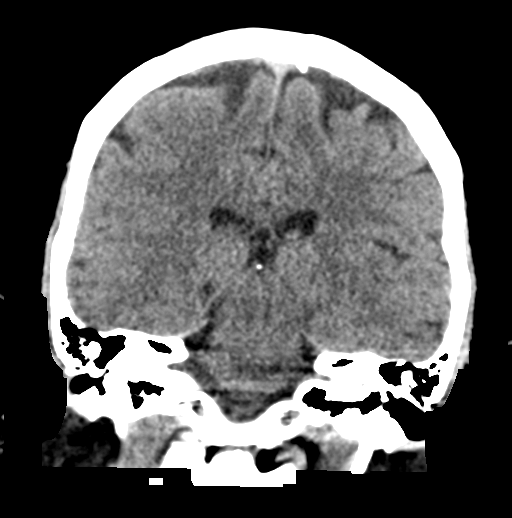

[Series 7: sagittal soft tissue · sagittal · 0.34mm/px · 3 of 59 slices shown]
[im 20/59  brain]
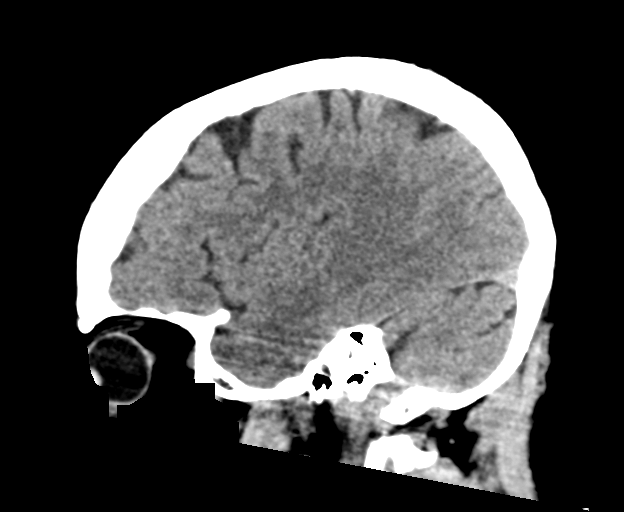
[im 30/59  brain]
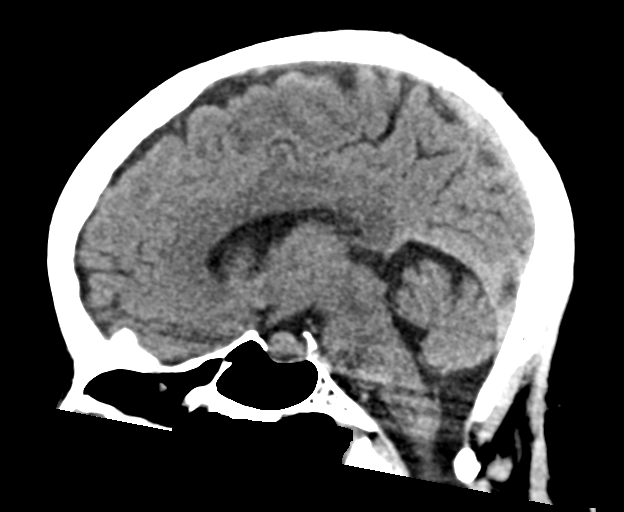
[im 39/59  brain]
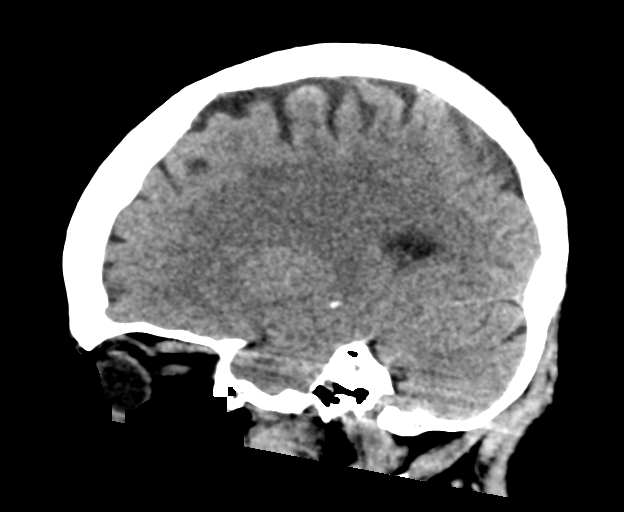

[14 of 47 positions shown; findings below may reference images not displayed]

FINDINGS: Brain: No evidence of acute infarction, hemorrhage, hydrocephalus,
extra-axial collection or mass lesion/mass effect.

Vascular: No hyperdense vessel or unexpected calcification.

Skull: Normal. Negative for fracture or focal lesion.

Sinuses/Orbits: No acute finding.

Other: None.
IMPRESSION: 1. No acute intracranial abnormalities. The appearance of the brain
is normal for age.

## 2021-04-14 ENCOUNTER — Telehealth: Payer: Self-pay | Admitting: Family

## 2021-04-14 NOTE — Telephone Encounter (Signed)
Pt seen by Gillian Shields, NP 06/30/20 and was referred to EP (Dr. Lalla Brothers). Pt saw Dr. Lalla Brothers in office 07/06/20 and was told to follow up PRN.  Lisinopril was not on med list at last ov in our office.  In Care Everywhere on 04/12/21 there is a refill sent by [facility] for Lisinopril 20mg  QD.   Called pt to discuss. No answer. Lmtcb.   Pt should follow up with [facility] as they sent in Rx for Lisinopril.

## 2021-04-14 NOTE — Telephone Encounter (Signed)
Please advise 

## 2021-04-14 NOTE — Telephone Encounter (Signed)
Patient would also like to discuss the medication dosage and uses

## 2021-04-14 NOTE — Telephone Encounter (Signed)
*  STAT* If patient is at the pharmacy, call can be transferred to refill team.   1. Which medications need to be refilled? (please list name of each medication and dose if known) lisinopril (dosage unknown by pt, thinks it may be 5mg )  2. Which pharmacy/location (including street and city if local pharmacy) is medication to be sent to? [facility] in [place]  3. Do they need a 30 day or 90 day supply? 90

## 2021-04-21 NOTE — Telephone Encounter (Signed)
Lmovm to verify and confirm medication No answer.

## 2021-04-24 NOTE — Telephone Encounter (Signed)
LMOVM to contact office/no answer/Prev. Attempts unsuccessful.
